# Patient Record
Sex: Female | Born: 1935 | Race: White | Hispanic: No | Smoking: Never smoker
Health system: Southern US, Community
[De-identification: ages and names within clinical notes are randomized; demographics above are authoritative.]

## PROBLEM LIST (undated history)

## (undated) DIAGNOSIS — E039 Hypothyroidism, unspecified: Secondary | ICD-10-CM

## (undated) DIAGNOSIS — I10 Essential (primary) hypertension: Secondary | ICD-10-CM

## (undated) DIAGNOSIS — C50919 Malignant neoplasm of unspecified site of unspecified female breast: Secondary | ICD-10-CM

## (undated) DIAGNOSIS — K5900 Constipation, unspecified: Secondary | ICD-10-CM

## (undated) DIAGNOSIS — K259 Gastric ulcer, unspecified as acute or chronic, without hemorrhage or perforation: Secondary | ICD-10-CM

## (undated) DIAGNOSIS — M1990 Unspecified osteoarthritis, unspecified site: Secondary | ICD-10-CM

## (undated) DIAGNOSIS — I442 Atrioventricular block, complete: Secondary | ICD-10-CM

## (undated) DIAGNOSIS — K219 Gastro-esophageal reflux disease without esophagitis: Secondary | ICD-10-CM

## (undated) HISTORY — PX: CHOLECYSTECTOMY: SHX55

## (undated) HISTORY — DX: Hypothyroidism, unspecified: E03.9

## (undated) HISTORY — DX: Essential (primary) hypertension: I10

## (undated) HISTORY — DX: Atrioventricular block, complete: I44.2

## (undated) HISTORY — DX: Malignant neoplasm of unspecified site of unspecified female breast: C50.919

## (undated) HISTORY — DX: Unspecified osteoarthritis, unspecified site: M19.90

## (undated) HISTORY — DX: Constipation, unspecified: K59.00

## (undated) HISTORY — DX: Gastro-esophageal reflux disease without esophagitis: K21.9

## (undated) HISTORY — DX: Gastric ulcer, unspecified as acute or chronic, without hemorrhage or perforation: K25.9

---

## 2002-02-10 ENCOUNTER — Encounter: Payer: Self-pay | Admitting: Surgery

## 2002-02-10 ENCOUNTER — Encounter: Admission: RE | Admit: 2002-02-10 | Discharge: 2002-02-10 | Payer: Self-pay | Admitting: Surgery

## 2002-02-10 ENCOUNTER — Encounter (INDEPENDENT_AMBULATORY_CARE_PROVIDER_SITE_OTHER): Payer: Self-pay

## 2002-02-10 ENCOUNTER — Other Ambulatory Visit: Admission: RE | Admit: 2002-02-10 | Discharge: 2002-02-10 | Payer: Self-pay | Admitting: Diagnostic Radiology

## 2002-02-17 ENCOUNTER — Encounter: Payer: Self-pay | Admitting: Surgery

## 2002-02-17 ENCOUNTER — Encounter: Admission: RE | Admit: 2002-02-17 | Discharge: 2002-02-17 | Payer: Self-pay | Admitting: Surgery

## 2002-02-19 ENCOUNTER — Encounter: Admission: RE | Admit: 2002-02-19 | Discharge: 2002-02-19 | Payer: Self-pay | Admitting: Surgery

## 2002-02-19 ENCOUNTER — Encounter (INDEPENDENT_AMBULATORY_CARE_PROVIDER_SITE_OTHER): Payer: Self-pay | Admitting: *Deleted

## 2002-02-19 ENCOUNTER — Ambulatory Visit (HOSPITAL_BASED_OUTPATIENT_CLINIC_OR_DEPARTMENT_OTHER): Admission: RE | Admit: 2002-02-19 | Discharge: 2002-02-20 | Payer: Self-pay | Admitting: Surgery

## 2002-02-19 ENCOUNTER — Encounter: Payer: Self-pay | Admitting: Surgery

## 2002-03-02 ENCOUNTER — Ambulatory Visit: Admission: RE | Admit: 2002-03-02 | Discharge: 2002-05-31 | Payer: Self-pay | Admitting: Radiation Oncology

## 2002-03-19 ENCOUNTER — Ambulatory Visit (HOSPITAL_COMMUNITY): Admission: RE | Admit: 2002-03-19 | Discharge: 2002-03-19 | Payer: Self-pay | Admitting: Oncology

## 2002-03-19 ENCOUNTER — Encounter: Payer: Self-pay | Admitting: Oncology

## 2002-03-23 ENCOUNTER — Encounter: Payer: Self-pay | Admitting: Oncology

## 2002-03-23 ENCOUNTER — Encounter: Payer: Self-pay | Admitting: Surgery

## 2002-03-23 ENCOUNTER — Ambulatory Visit (HOSPITAL_COMMUNITY): Admission: RE | Admit: 2002-03-23 | Discharge: 2002-03-23 | Payer: Self-pay | Admitting: Oncology

## 2002-03-23 ENCOUNTER — Ambulatory Visit (HOSPITAL_BASED_OUTPATIENT_CLINIC_OR_DEPARTMENT_OTHER): Admission: RE | Admit: 2002-03-23 | Discharge: 2002-03-23 | Payer: Self-pay | Admitting: Surgery

## 2002-07-22 ENCOUNTER — Ambulatory Visit: Admission: RE | Admit: 2002-07-22 | Discharge: 2002-10-13 | Payer: Self-pay | Admitting: Radiation Oncology

## 2003-08-13 ENCOUNTER — Ambulatory Visit (HOSPITAL_BASED_OUTPATIENT_CLINIC_OR_DEPARTMENT_OTHER): Admission: RE | Admit: 2003-08-13 | Discharge: 2003-08-13 | Payer: Self-pay | Admitting: Surgery

## 2003-10-28 ENCOUNTER — Ambulatory Visit (HOSPITAL_BASED_OUTPATIENT_CLINIC_OR_DEPARTMENT_OTHER): Admission: RE | Admit: 2003-10-28 | Discharge: 2003-10-28 | Payer: Self-pay | Admitting: Neurology

## 2004-01-06 ENCOUNTER — Encounter: Admission: RE | Admit: 2004-01-06 | Discharge: 2004-01-06 | Payer: Self-pay | Admitting: Family Medicine

## 2004-01-06 ENCOUNTER — Other Ambulatory Visit: Admission: RE | Admit: 2004-01-06 | Discharge: 2004-01-06 | Payer: Self-pay | Admitting: Family Medicine

## 2004-01-25 ENCOUNTER — Encounter: Admission: RE | Admit: 2004-01-25 | Discharge: 2004-01-25 | Payer: Self-pay | Admitting: Family Medicine

## 2005-01-11 ENCOUNTER — Ambulatory Visit: Payer: Self-pay | Admitting: Family Medicine

## 2005-04-05 ENCOUNTER — Ambulatory Visit: Payer: Self-pay | Admitting: Oncology

## 2005-07-25 ENCOUNTER — Ambulatory Visit: Payer: Self-pay | Admitting: Family Medicine

## 2005-10-04 ENCOUNTER — Ambulatory Visit: Payer: Self-pay | Admitting: Oncology

## 2005-12-06 ENCOUNTER — Ambulatory Visit: Payer: Self-pay | Admitting: Oncology

## 2005-12-31 HISTORY — PX: DOPPLER ECHOCARDIOGRAPHY: SHX263

## 2006-04-04 ENCOUNTER — Ambulatory Visit: Payer: Self-pay | Admitting: Oncology

## 2006-10-01 ENCOUNTER — Ambulatory Visit: Payer: Self-pay | Admitting: Oncology

## 2006-10-21 ENCOUNTER — Encounter: Admission: RE | Admit: 2006-10-21 | Discharge: 2006-10-21 | Payer: Self-pay | Admitting: Orthopedic Surgery

## 2006-11-01 ENCOUNTER — Ambulatory Visit: Payer: Self-pay | Admitting: Internal Medicine

## 2006-11-01 ENCOUNTER — Inpatient Hospital Stay (HOSPITAL_COMMUNITY): Admission: RE | Admit: 2006-11-01 | Discharge: 2006-11-06 | Payer: Self-pay | Admitting: Orthopedic Surgery

## 2006-11-01 ENCOUNTER — Ambulatory Visit: Payer: Self-pay | Admitting: Pulmonary Disease

## 2006-11-04 ENCOUNTER — Encounter: Payer: Self-pay | Admitting: Internal Medicine

## 2006-11-05 ENCOUNTER — Encounter: Payer: Self-pay | Admitting: Internal Medicine

## 2007-02-21 ENCOUNTER — Encounter: Admission: RE | Admit: 2007-02-21 | Discharge: 2007-02-21 | Payer: Self-pay | Admitting: Surgery

## 2007-02-21 ENCOUNTER — Encounter (INDEPENDENT_AMBULATORY_CARE_PROVIDER_SITE_OTHER): Payer: Self-pay | Admitting: Specialist

## 2007-03-12 ENCOUNTER — Ambulatory Visit: Payer: Self-pay | Admitting: Oncology

## 2007-08-14 ENCOUNTER — Ambulatory Visit: Payer: Self-pay | Admitting: Oncology

## 2007-12-16 ENCOUNTER — Ambulatory Visit: Payer: Self-pay | Admitting: Oncology

## 2008-02-15 IMAGING — RF DG ARTHROGRAM SHOULDER*R*
8 series · 8 of 8 positions shown · non-contrast
Comparison: none

CLINICAL DATA: Right shoulder pain.  Limited range of motion.  Assess for cuff tear. 
RIGHT SHOULDER ARTHROGRAM:
TECHNIQUE: The skin anterior to the right shoulder was cleansed and anesthetized.  A 22 gauge spinal needle was placed into the glenohumeral joint.  Lechner mixed with an equal volume of 1% Lidocaine was injected into the joint.  A total volume of 10 cc was injected.

[Series 1: arthrogram · 1 of 1 slices shown (1 of 8)]
[im 1/1]
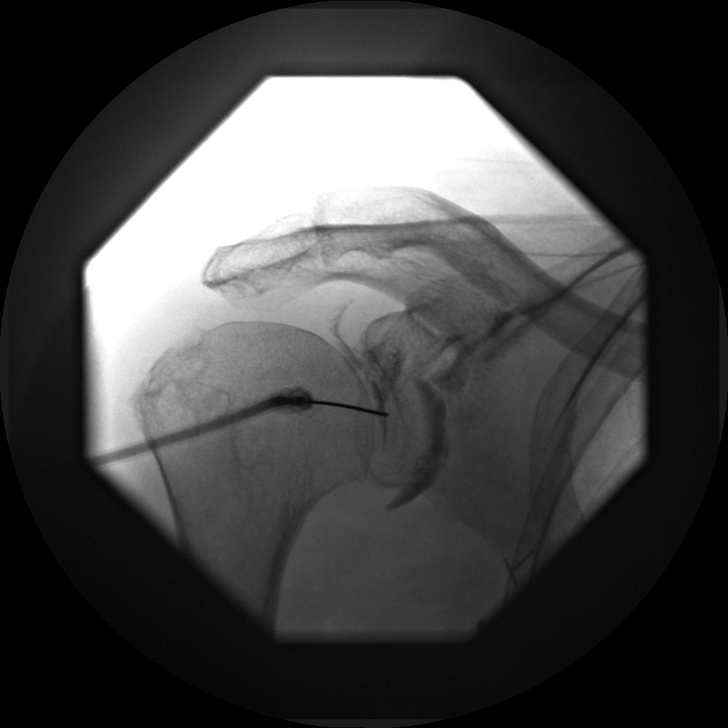

[Series 2: arthrogram · 1 of 1 slices shown (2 of 8)]
[im 1/1]
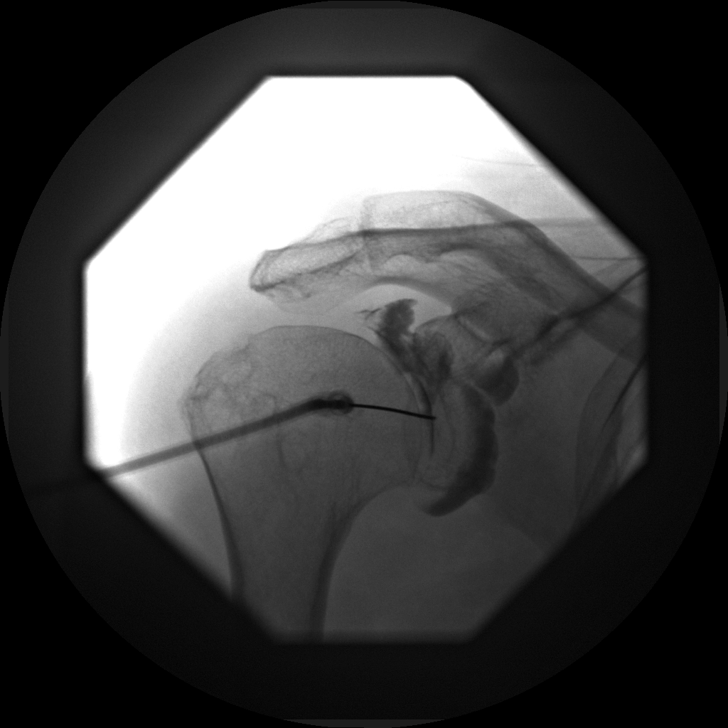

[Series 3: arthrogram · 1 of 1 slices shown (3 of 8)]
[im 1/1]
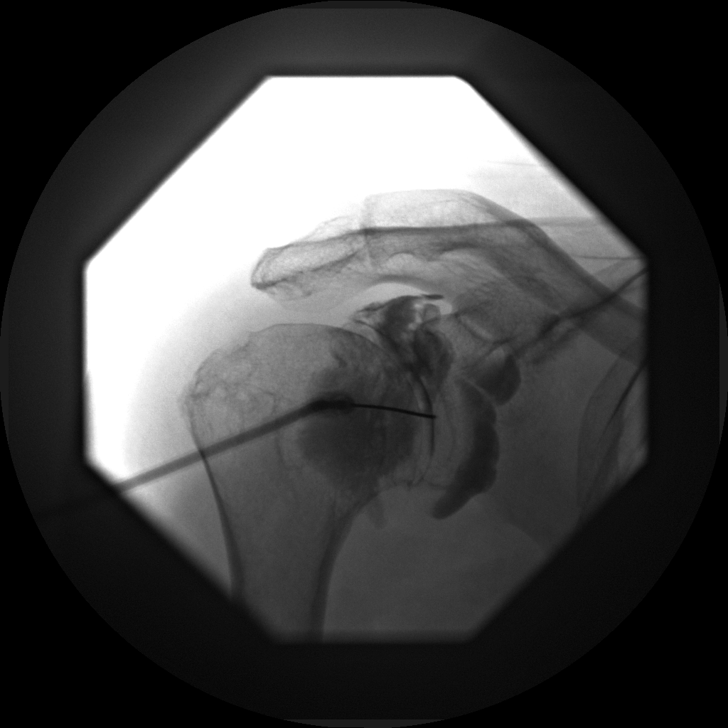

[Series 4: arthrogram · 1 of 1 slices shown (4 of 8)]
[im 1/1]
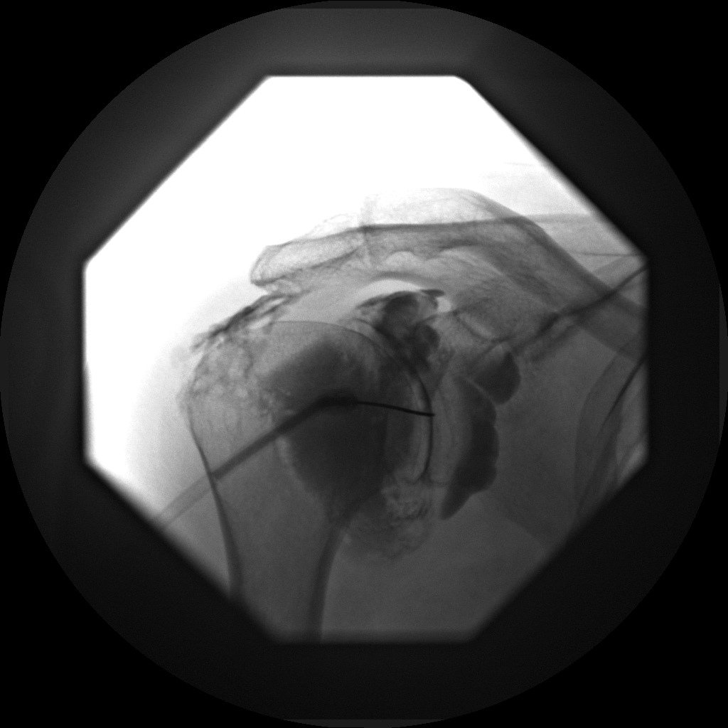

[Series 5: arthrogram · 1 of 1 slices shown (5 of 8)]
[im 1/1]
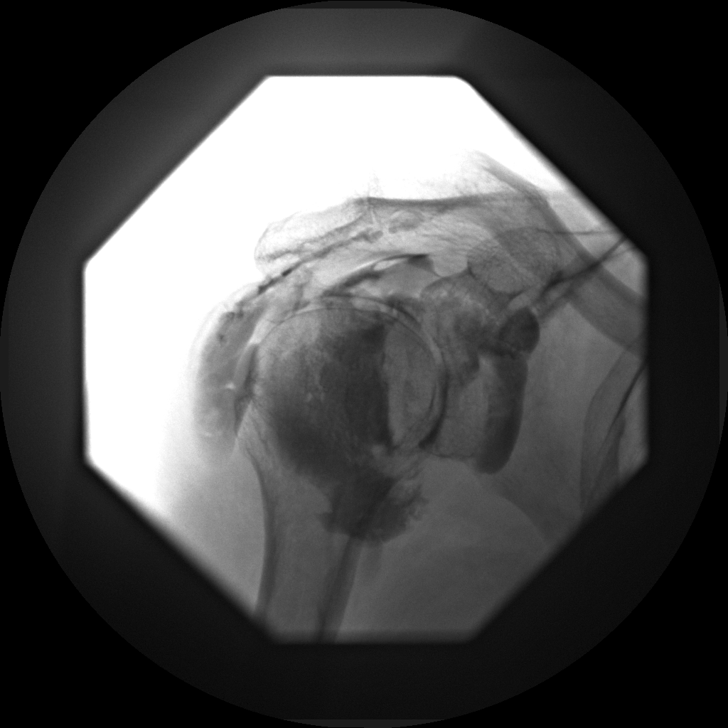

[Series 6: arthrogram · 1 of 1 slices shown (6 of 8)]
[im 1/1]
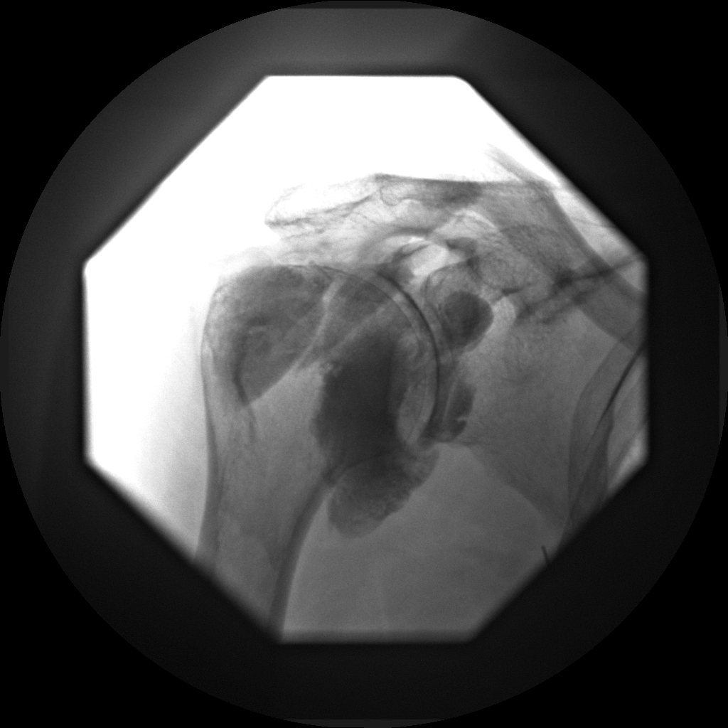

[Series 7: arthrogram · 1 of 1 slices shown (7 of 8)]
[im 1/1]
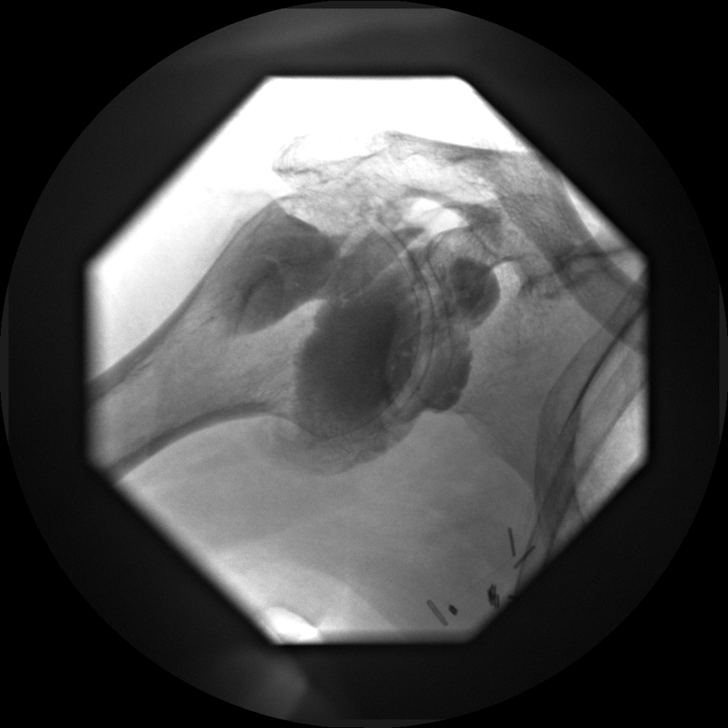

[Series 8: arthrogram · 1 of 1 slices shown (8 of 8)]
[im 1/1]
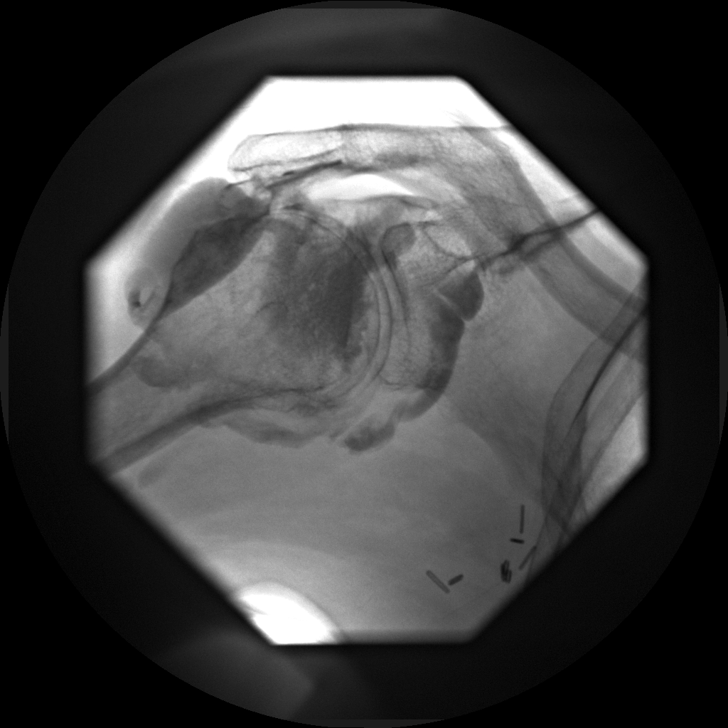

[8 of 8 positions shown; findings below may reference images not displayed]

FINDINGS: There is a large full thickness tear allowing free passage of contrast into the subacromial subdeltoid bursa.  I think this is probably in the supraspinatus region.  The glenohumeral articulation appears smooth.
IMPRESSION: Large full thickness rotator cuff tear allowing free passage of contrast into the subacromial subdeltoid bursa.  This tear is probably in the supraspinatus region.

## 2008-06-15 ENCOUNTER — Ambulatory Visit: Payer: Self-pay | Admitting: Oncology

## 2008-06-17 ENCOUNTER — Ambulatory Visit (HOSPITAL_COMMUNITY): Admission: RE | Admit: 2008-06-17 | Discharge: 2008-06-17 | Payer: Self-pay | Admitting: Oncology

## 2008-06-24 ENCOUNTER — Ambulatory Visit (HOSPITAL_COMMUNITY): Admission: RE | Admit: 2008-06-24 | Discharge: 2008-06-24 | Payer: Self-pay | Admitting: Oncology

## 2008-07-20 ENCOUNTER — Ambulatory Visit: Payer: Self-pay | Admitting: Ophthalmology

## 2008-07-20 ENCOUNTER — Other Ambulatory Visit: Payer: Self-pay

## 2008-08-03 ENCOUNTER — Ambulatory Visit: Payer: Self-pay | Admitting: Ophthalmology

## 2008-09-09 ENCOUNTER — Ambulatory Visit: Payer: Self-pay | Admitting: Ophthalmology

## 2008-09-14 ENCOUNTER — Ambulatory Visit: Payer: Self-pay | Admitting: Ophthalmology

## 2008-09-15 ENCOUNTER — Ambulatory Visit: Payer: Self-pay | Admitting: Oncology

## 2008-12-15 ENCOUNTER — Encounter: Admission: RE | Admit: 2008-12-15 | Discharge: 2008-12-15 | Payer: Self-pay | Admitting: Orthopedic Surgery

## 2009-08-22 ENCOUNTER — Ambulatory Visit: Payer: Self-pay | Admitting: Internal Medicine

## 2009-08-22 DIAGNOSIS — R195 Other fecal abnormalities: Secondary | ICD-10-CM

## 2009-08-22 DIAGNOSIS — K5900 Constipation, unspecified: Secondary | ICD-10-CM | POA: Insufficient documentation

## 2009-08-22 DIAGNOSIS — K219 Gastro-esophageal reflux disease without esophagitis: Secondary | ICD-10-CM | POA: Insufficient documentation

## 2009-08-23 ENCOUNTER — Telehealth: Payer: Self-pay | Admitting: Internal Medicine

## 2009-09-14 ENCOUNTER — Ambulatory Visit: Payer: Self-pay | Admitting: Oncology

## 2009-09-16 ENCOUNTER — Encounter: Payer: Self-pay | Admitting: Internal Medicine

## 2009-09-26 ENCOUNTER — Ambulatory Visit: Payer: Self-pay | Admitting: Internal Medicine

## 2009-10-04 ENCOUNTER — Encounter: Payer: Self-pay | Admitting: Internal Medicine

## 2009-10-04 ENCOUNTER — Ambulatory Visit: Payer: Self-pay | Admitting: Internal Medicine

## 2009-10-04 DIAGNOSIS — K259 Gastric ulcer, unspecified as acute or chronic, without hemorrhage or perforation: Secondary | ICD-10-CM | POA: Insufficient documentation

## 2009-10-04 LAB — CONVERTED CEMR LAB: UREASE: NEGATIVE

## 2009-10-05 ENCOUNTER — Encounter: Payer: Self-pay | Admitting: Internal Medicine

## 2010-06-30 ENCOUNTER — Encounter (HOSPITAL_COMMUNITY): Admission: RE | Admit: 2010-06-30 | Discharge: 2010-06-30 | Payer: Self-pay | Admitting: Orthopedic Surgery

## 2010-08-01 ENCOUNTER — Inpatient Hospital Stay (HOSPITAL_COMMUNITY): Admission: RE | Admit: 2010-08-01 | Discharge: 2010-08-04 | Payer: Self-pay | Admitting: Orthopedic Surgery

## 2010-09-11 ENCOUNTER — Ambulatory Visit: Payer: Self-pay | Admitting: Oncology

## 2011-03-16 LAB — CBC
HCT: 25.5 % — ABNORMAL LOW (ref 36.0–46.0)
MCH: 31.6 pg (ref 26.0–34.0)
MCHC: 34.9 g/dL (ref 30.0–36.0)
Platelets: 122 10*3/uL — ABNORMAL LOW (ref 150–400)
Platelets: 96 10*3/uL — ABNORMAL LOW (ref 150–400)
RBC: 2.24 MIL/uL — ABNORMAL LOW (ref 3.87–5.11)
RDW: 14.3 % (ref 11.5–15.5)
RDW: 14.6 % (ref 11.5–15.5)
WBC: 5.4 10*3/uL (ref 4.0–10.5)
WBC: 5.4 10*3/uL (ref 4.0–10.5)

## 2011-03-16 LAB — BASIC METABOLIC PANEL
BUN: 15 mg/dL (ref 6–23)
BUN: 17 mg/dL (ref 6–23)
CO2: 23 mEq/L (ref 19–32)
CO2: 27 mEq/L (ref 19–32)
Calcium: 7.1 mg/dL — ABNORMAL LOW (ref 8.4–10.5)
Calcium: 7.1 mg/dL — ABNORMAL LOW (ref 8.4–10.5)
Calcium: 7.3 mg/dL — ABNORMAL LOW (ref 8.4–10.5)
Creatinine, Ser: 0.84 mg/dL (ref 0.4–1.2)
Creatinine, Ser: 0.88 mg/dL (ref 0.4–1.2)
Creatinine, Ser: 1.12 mg/dL (ref 0.4–1.2)
GFR calc Af Amer: >60 mL/min (ref >60)
GFR calc non Af Amer: 47 mL/min — ABNORMAL LOW (ref >60)
GFR calc non Af Amer: >60 mL/min (ref >60)
Glucose, Bld: 103 mg/dL — ABNORMAL HIGH (ref 70–99)
Glucose, Bld: 112 mg/dL — ABNORMAL HIGH (ref 70–99)
Potassium: 3.7 mEq/L (ref 3.5–5.1)
Sodium: 140 mEq/L (ref 135–145)

## 2011-03-16 LAB — TYPE AND SCREEN: ABO/RH(D): A POS

## 2011-03-16 LAB — ABO/RH: ABO/RH(D): A POS

## 2011-03-16 LAB — HEMOGLOBIN AND HEMATOCRIT, BLOOD
HCT: 25.5 % — ABNORMAL LOW (ref 36.0–46.0)
Hemoglobin: 10.2 g/dL — ABNORMAL LOW (ref 12.0–15.0)
Hemoglobin: 8.8 g/dL — ABNORMAL LOW (ref 12.0–15.0)

## 2011-03-17 LAB — BASIC METABOLIC PANEL
BUN: 18 mg/dL (ref 6–23)
Calcium: 8.9 mg/dL (ref 8.4–10.5)
GFR calc non Af Amer: >60 mL/min (ref >60)
Glucose, Bld: 97 mg/dL (ref 70–99)
Potassium: 3.9 mEq/L (ref 3.5–5.1)

## 2011-03-17 LAB — CBC
HCT: 36.1 % (ref 36.0–46.0)
MCH: 32.2 pg (ref 26.0–34.0)
MCHC: 34.4 g/dL (ref 30.0–36.0)
RDW: 14.2 % (ref 11.5–15.5)

## 2011-03-17 LAB — DIFFERENTIAL
Basophils Absolute: 0 10*3/uL (ref 0.0–0.1)
Basophils Relative: 0 % (ref 0–1)
Eosinophils Absolute: 0 10*3/uL (ref 0.0–0.7)
Monocytes Absolute: 0.4 10*3/uL (ref 0.1–1.0)
Neutro Abs: 3.1 10*3/uL (ref 1.7–7.7)

## 2011-03-17 LAB — APTT: aPTT: 29 seconds (ref 24–37)

## 2011-03-17 LAB — URINALYSIS, ROUTINE W REFLEX MICROSCOPIC
Bilirubin Urine: NEGATIVE
Ketones, ur: NEGATIVE mg/dL
Nitrite: NEGATIVE
Urobilinogen, UA: 1 mg/dL (ref 0.0–1.0)

## 2011-05-18 NOTE — Op Note (Signed)
   NAME:  [name], [name]                       ACCOUNT NO.:  [id number]   MEDICAL RECORD NO.:  [id number]                   PATIENT TYPE:  AMB   LOCATION:  DSC                                  FACILITY:  [facility]   PHYSICIAN:  Currie Paris, M.D.           DATE OF BIRTH:  Nov 10, 1930   DATE OF PROCEDURE:  08/13/2003  DATE OF DISCHARGE:                                 OPERATIVE REPORT   PREOPERATIVE DIAGNOSIS:  Unnecessary porta-cath.   POSTOPERATIVE DIAGNOSIS:  Unnecessary porta-cath.   PROCEDURE PERFORMED:  Removal  of porta-cath.   SURGEON:  Currie Paris, M.D.   ANESTHESIA:  Local.   CLINICAL HISTORY:  [name] as finished her chemotherapy and wishes to  have her porta-cath removed.   DESCRIPTION OF PROCEDURE:  In the minor procedure room the area of  the  porta-cath was prepped with some alcohol  and anesthetized with 1% Xylocaine  with epinephrine using 10 mL. I waited 10 minutes, came back and then  reprepped with Betadine and draped out.   The old scar was opened. The porta-cath reservoir was identified. The  holding sutures were cut and the reservoir was  backed out. The tubing was  backed about half-way out and I put a little Vicryl suture in to make sure  there was no backbleeding through the tract. The catheter was removed intact  and the suture tied down.   Then 3-0 Vicryl and 4-0 Monocryl were used to close the incision in layers.  Dermabond was used on the skin.   The patient tolerated the procedure well. There were no complications noted.                                               Currie Paris, M.D.    CJS/MEDQ  D:  08/13/2003  T:  08/13/2003  Job:  [id number]

## 2011-05-18 NOTE — Op Note (Signed)
[place]. [facility]  Patient:    [name], [name] Visit Number: [id number] MRN: [id number]          Service Type: DSU Location: [facility] Attending Physician:  Charlton Haws Dictated by:   Currie Paris, M.D. Proc. Date: 03/23/02 Admit Date:  03/23/2002   CC:         Jillyn Hidden B. Truett Perna, M.D.   Operative Report  VISIT NUMBER:  [id number]  PREOPERATIVE DIAGNOSES: 1. Carcinoma of right breast. 2. Inadequate venous access for chemotherapy.  POSTOPERATIVE DIAGNOSES: 1. Carcinoma of right breast. 2. Inadequate venous access for chemotherapy.  OPERATION:  Placement of Port-A-Cath.  SURGEON:  Currie Paris, M.D.  ANESTHESIA:  MAC.  CLINICAL HISTORY:  This patient is a 77 year old getting ready to have chemotherapy tomorrow and needs better IV access.  DESCRIPTION OF PROCEDURE:  The patient was seen in the holding area and had no further questions regarding her surgery.  She was taken to the operating room and given IV sedation.  The entire upper chest and lower neck area was prepped and draped as a single sterile field.  The left subclavian vein was entered on the initial stick and the guidewire threaded easily and positioned in the superior vena cava.  Additional local was infiltrated and a transverse incision made anteriorly and just medial to the sternum in a pocket fashion for the reservoir.  Additional local was infiltrated, and a tunnel made from the guidewire site into the reservoir site, and the Port-A-Cath tubing pulled through.  It was flushed.  The guidewire tract was dilated once with a #10 dilator followed by the dilator and peel-away sheath.  They went easily. The dilator and guidewire were removed, and the catheter threaded easily and position appeared to be in the right atrium.  It aspirated and irrigated easily.  It was backed up so that it was in the superior vena cava right at the junction of the right atrium.  It  was then attached to the reservoir which had been previously flushed.  The reservoir aspirated and irrigated easily. The reservoir was sutured to the fascia with some 3-0 Prolene and appeared to lay in good position.  There was a fair amount of adipose tissue here, so I trimmed some of that off to make the reservoir a little bit more palpable for IV access.  The wound was closed with 3-0 Vicryl followed by 4-0 Monocryl subcuticular.  A right angle Huber point needle with tubing was used to access it to flush it a final time and leave the access available for chemotherapy tomorrow.  This went easily and aspirated and irrigated easily.  A final check with fluoroscopy showed good positioning.  No obvious pneumothorax and no kinking.  The catheter was flushed a final time with concentrated aqueous heparin. Steri-Strips were applied.  The patient tolerated the procedure well.  There were no operative complications.  All counts were correct. Dictated by:   Currie Paris, M.D. Attending Physician:  Charlton Haws DD:  03/23/02 TD:  03/24/02 Job: [id number]

## 2011-05-18 NOTE — H&P (Signed)
NAMEMAN, BONNEAU             ACCOUNT NO.:  [id number]   MEDICAL RECORD NO.:  [id number]          PATIENT TYPE:  INP   LOCATION:  1404                         FACILITY:  [facility]   PHYSICIAN:  Marlowe Kays, M.D.  DATE OF BIRTH:  1930-12-04   DATE OF ADMISSION:  11/01/2006  DATE OF DISCHARGE:  11/06/2006                                HISTORY & PHYSICAL   This History and Physical was dictated from the hospital record of admission  of November 01, 2006.   CHIEF COMPLAINT:  Pain in my right shoulder.   HISTORY OF PRESENT ILLNESS:  This 77 year old white female with continuing  problems concerning pain into her right shoulder with difficulty and range  of motion through the right shoulder on any movement.   Studies had shown a full thickness rotator cuff tear on the arthrogram.  Since she is a very active lady, it was felt that she would benefit from  surgical intervention. Was scheduled for repair of the full thickness  rotator cuff of the right shoulder.   ALLERGIES:  The patient is allergic to SULFA which causes itching.   She does have some hypertension. Denies any cardiac problems. She has  hypothyroidism. She also has some mild reflux. Her family P.A. is Mr. Lonie Peak, physician's assistant, at [employer] in [place],  [place].   PAST MEDICAL HISTORY:  Lumpectomy for breast cancer in 2003. She had chemo  and radiation after that. Hysterectomy 1969, cholecystectomy 1994, bilateral  trigger release of her hands in 2006.   CURRENT MEDICATIONS:  1. Levothyroxine 100 mg p.o. q.a.m.  2. Arimidex 1 mg p.o. q.a.m.  3. Omeprazole 20 mg p.o. two tabs in a.m.  4. Hydrochlorothiazide 25 mg p.o. q.a.m.  5. Ecotrin 325 mg p.o. q.a.m. Will stop prior to surgery.  6. Caltrate Plus 600 mg one b.i.d.  7. Mirapex 1 mg tab as needed during the day.  8. Gabapentin 300 mg p.o. q.h.s.  9. Lopressor 75 mg b.i.d.   FAMILY HISTORY/SOCIAL HISTORY:   Noncontributory.   REVIEW OF SYSTEMS:  CNS:  No seizures, auto paralysis, numbness, double  vision. RESPIRATORY:  No productive cough, no hemoptysis, no shortness of  breath. CARDIOVASCULAR:  No chest pain, no angina, no orthopnea. Denies any  cardiac history. GASTROINTESTINAL:  No nausea, vomiting, melena, bloody  stool. She does have mild reflux which is controlled with medication.  GENITOURINARY:  No discharge, dysuria, hematuria. MUSCULOSKELETAL:  Primarily in Present Illness.   PHYSICAL EXAMINATION:  VITAL SIGNS:  Vital signs are not listed on the  admission health history.  GENERAL:  Alert, cooperative 77 year old white female.  HEENT:  Normocephalic, PERRLA, EOMI intact, oropharynx is clear.  NECK:  Supple, no lymphadenopathy.  CHEST:  Clear to auscultation, rhonchi or rales.  HEART:  With a regular rate and rhythm.  ABDOMEN:  Soft, nontender.  GENITOURINARY:  Not done. Not pertinent to Present Illness.  EXTREMITIES:  Tender rotator cuff of the right shoulder and painful range of  motion.   ADMISSION DIAGNOSES:  1. Tear of the rotator cuff of the right shoulder.  2.  Hypothyroidism.  3. Hypertension.  4. Mild gastroesophageal reflux.   PLAN:  The patient will undergo open repair of the rotator cuff of the right  shoulder.      Dooley L. Cherlynn June.    ______________________________  Marlowe Kays, M.D.    DLU/MEDQ  D:  11/20/2006  T:  11/20/2006  Job:  [id number]

## 2011-05-18 NOTE — Consult Note (Signed)
NAMEJOSSALYN, Palmer             ACCOUNT NO.:  [id number]   MEDICAL RECORD NO.:  [id number]          PATIENT TYPE:  AMB   LOCATION:  DAY                          FACILITY:  [facility]   PHYSICIAN:  Madolyn Frieze. Jens Som, MD, FACCDATE OF BIRTH:  1930/06/19   DATE OF CONSULTATION:  11/01/2006  DATE OF DISCHARGE:                                   CONSULTATION   [name] is a 77 year old female with a past medical history of  hypertension, gastroesophageal reflux disease, hypothyroidism, history of  breast cancer status post left lumpectomy in 2003, and now status post  attempt at rotator cuff repair, who we are asked to evaluate for pulmonary  edema.  She apparently has  no prior cardiac history, although she is  intubated and just awakening from general anesthesia, therefore, the history  is somewhat difficult.  She presented today for repair of her rotator cuff.  She was placed under general anesthesia.  They sat the patient up and were  beginning the incision when she developed sudden bradycardia with heart  rates in the 30s and hypotension with systolic blood pressure in the 60s.  She was subsequently given Neo-Synephrine, Robinul, and Ephedrine.  She then  became hypertensive with a systolic blood pressure of approximately 180 and  diastolic 110.  Her heart rate increased to 150.  She subsequently developed  pulmonary edema and cardiology is asked to further evaluate.  Note, the  procedure was aborted.  At present, she is intubated and somewhat lethargic  from her anesthesia.  However, she states her head no when asked about chest  pain.   Her past medical history is significant for breast cancer status post  lumpectomy in 2003.  She also has hypertension, hypothyroidism, and  gastroesophageal reflux disease.  Note, she did have an Adenosine Myoview in  January 2005 and this showed an ejection fraction of 70% and no ischemia or  infarction.   MEDICATIONS PRIOR TO ADMISSION:   Synthroid 100 mcg p.o. daily, Arimidex 1 mg  daily, Prilosec 40 mg daily, hydrochlorothiazide 25 mg daily, aspirin 325 mg  p.o. daily, Caltrate, Mirapex, and Gabapentin.   ALLERGIES:  SULFA.   Her social history, family history, and review of systems are not obtainable  as she is intubated and just recovered from general anesthesia.   PHYSICAL EXAMINATION:  VITAL SIGNS:  Blood pressure at present is 92/56 and pulse 101.  GENERAL:  She is well developed and intubated.  She does not respond to  questions other than a gentle shake of her head.  HEENT:  Unremarkable with normal eyelids.  NECK:  Supple with a normal upstroke bilaterally and I cannot appreciate  bruits.  There is no jugular venous distention and I cannot appreciate  thyromegaly.  CHEST:  Significant for diffuse rhonchi and rales.  CARDIOVASCULAR:  Regular rate and rhythm with a normal S1 and S2.  Her heart  sounds are distant due to her pulmonary exam.  however, I cannot appreciate  murmurs, gallops, and rubs.  ABDOMEN:  Nontender, nondistended, positive bowel sounds, no  hepatosplenomegaly, no masses appreciated.  There is no  abdominal bruit.  She has 2+ femoral pulses bilaterally and no bruits.  EXTREMITIES:  No edema, I can palpate no cords.  She has 2+ dorsalis pedes  pulses bilaterally.  NEUROLOGICAL:  Not obtainable due to the patient's intubated status.   Her electrocardiogram shows a sinus rhythm at a rate of 99.  There is a  right bundle branch block and left anterior fascicular block.  There is left  ventricular hypertrophy.  Her chest x-ray shows diffuse pulmonary edema.  Her hemoglobin is 12.4 with hematocrit 36.5.  Her potassium was 3.7 with BUN  20 and creatinine 0.7.   DIAGNOSIS:  1. Acute pulmonary edema.  2. Status post attempt at repair of rotator cuff.  3. History of hypertension.  4. History of hypothyroidism.   PLAN:  Ms. Heine is now intubated and has developed acute pulmonary  edema  following her surgical attempts.  We feel this is most likely related  to therapy for her hypotension and bradycardia in the operating room.  After  receiving medications, her blood pressure did increase and she became  tachycardic with a heart rate of 150.  Certainly, this could have caused her  pulmonary edema particularly if it was superimposed on underlying coronary  disease.  We would recommend weaning her pressors as tolerated.  I would  recommend adding aspirin and heparin if OK with surgery.  We will add  Lopressor after her pressors are weaned as tolerated by pulse and blood  pressure.  We would also recommend gentle diuresis as tolerated by blood  pressure.  We will check an echocardiogram to quantify left ventricular  function.  We will cycle enzymes to rule out myocardial infarction.  Ultimately, after extubation and the patient is more stable, she may require  cardiac catheterization.  We will consult critical care to wean the  ventilator.           ______________________________  Madolyn Frieze. Jens Som, MD, [facility]     BSC/MEDQ  D:  11/01/2006  T:  11/02/2006  Job:  [id number]

## 2011-05-18 NOTE — Op Note (Signed)
[place]. [facility]  Patient:    [name], [name] Visit Number: [id number] MRN: [id number]          Service Type: DSU Location: [facility] Attending Physician:  Charlton Haws Dictated by:   Currie Paris, M.D. Proc. Date: 02/19/02 Admit Date:  02/19/2002 Discharge Date: 02/19/2002   CC:         [facility], Dr. Elnita Maxwell Lugion  Evette Georges, M.D. [facility]   Operative Report  VISIT NUMBER:  [id number]  CCS NUMBER:  [phone]  PREOPERATIVE DIAGNOSIS:  Carcinoma of right breast, upper inner quadrant.  POSTOPERATIVE DIAGNOSIS:  Carcinoma of right breast, upper inner quadrant.  OPERATION:  Right needle-guided excisional lumpectomy with blue dye injection and sentinel lymph node dissection followed by complete axillary dissection.  SURGEON:  Currie Paris, M.D.  ANESTHESIA:  General (LMA).  CLINICAL HISTORY:  This patient has presented with a breast mass seen on mammography and core biopsy has shown carcinoma.  After discussion of alternatives with the patient, she is thought to be a good candidate for needle localization, wide local excision, and sentinel node dissection with possible full axillary dissection.  DESCRIPTION OF PROCEDURE:  The patient was brought to the operating room and after satisfactory general anesthesia had been obtained, the right breast was prepped and draped.  It was then injected with some blue dye.  The guidewire entered it medially and the breast then tracked a little bit laterally in the upper inner quadrant.  There was a mark that had been marked on the skin overlying the mass.  I initially made an elliptical incision to encompass the prior core biopsy site, and I go directly over and encompass the area of the tumor.  Using coagulation current of the cautery and cutting current, wide excision was taken, basically down to the muscle, although a little bit of fatty tissue  was left on the fascia as I initially came under this area.  On inspection, I seemed to be a little bit close at the deep superior area, and while this was going for specimen mammography, I went ahead and took the fascia off of here and around toward the superior area.  Once this was done, bleeders were electrocoagulated.  I raised the flaps underneath the breast so that it would make closure a little easier and temporarily placed a pack here.  Using the Neoprobe, I identified a hot area in the axilla, and made a short transverse incision, divided through the subcutaneous tissues, and initially encountered a lymph node which was not blue, and was fairly soft and fairly superficial, but had counts of around 300 with the background counts being around 10-15.  This was excised and sent as a sentinel node.  Further dissection just adjacent to this, I saw a blue lymphatic and traced this down a little further, and saw a soft blue node which was excised.  This also had counts in the range of about 700-800.  A little further dissection showed a second lymphatic and a larger blue lymph node that again was soft, but this one had counts of about 1400.  It was also excised.  Having removed this, I found no other hot areas in the axilla, nor any other blue areas.  While waiting for pathology, I went ahead and closed this area with some Vicryl followed by Monocryl.  I returned my attention to the breast area and closed it in several layers with 3-0 Vicryl followed  by 4-0 Monocryl.  A few minutes later, pathology reported that the lumpectomy specimen, the margin was close deep which is where I knew it was and we had already taken some more tissue, and that the third of the three lymph nodes was positive.  At this point, I reopened the incision, and extended it posteriorly, and continued dividing all subcutaneous tissues and axillary fatty tissue down to the chest wall.  I then opened along the  clavipectoral fascia, and traced this superiorly, pulling this tissue down until I could see the axillary vein.  The contents were then swept from here, medial-to-lateral and superior-to-inferior.  The long thoracic and thoracodorsal nerves were identified.  The second intercostal had to be taken and it was clipped.  Clips were used on the larger vessels and the cautery on the remaining.  At least one node by palpation appeared positive within this tissue, and it was close to the axillary vein, but not very high up in the axilla, more about the midportion of the dissection.  When I got out towards the latissimus and thoracodorsal vessels, the dissection was completed by dividing the remaining attachments here.  The wound was irrigated and checked for hemostasis and everything appeared to be dry.  I injected with a little Marcaine around the long thoracic, thoracodorsal, and second intercostal to see if that would help with postoperative analgesia.  A 19 Blake drain was placed through a separate stab wound and secured with a 3-0 nylon.  The wound was closed with 3-0 Vicryl followed by staples.  The patient tolerated the procedure well.  There were no operative complications.  All counts were correct. Dictated by:   Currie Paris, M.D. Attending Physician:  Charlton Haws DD:  02/19/02 TD:  02/20/02 Job: [id number]

## 2011-05-18 NOTE — Op Note (Signed)
NAMEJORDIE, SKALSKY             ACCOUNT NO.:  [id number]   MEDICAL RECORD NO.:  [id number]          PATIENT TYPE:  AMB   LOCATION:  DAY                          FACILITY:  [facility]   PHYSICIAN:  Marlowe Kays, M.D.  DATE OF BIRTH:  27-Jul-1930   DATE OF PROCEDURE:  11/01/2006  DATE OF DISCHARGE:                                 OPERATIVE REPORT   PREOPERATIVE DIAGNOSIS:  Full thickness rotator cuff tear right shoulder.   POSTOPERATIVE DIAGNOSIS:  1. Probable full thickness rotator cuff tear, right shoulder.  2. Interoperative cardiopulmonary disturbances leading to premature      termination of the operation.   OPERATION:  Incision and closure of right shoulder down through the  subcutaneous tissue.   SURGEON:  Marlowe Kays, M.D.   ASSISTANT:  Jamelle Rushing, P.A.-C.   ANESTHESIA:  General.   JUSTIFICATION FOR OF PROCEDURE:  She had a full thickness rotator cuff tear  demonstrated on arthrogram.  Preoperatively, she had an interscalene block  with no problems.  When I saw her in the anesthesia holding area just before  going back to the operating room, I did notice a cardiac arrhythmia which  seemed to be more atrial and was noted also by anesthesia.   DESCRIPTION OF PROCEDURE:  After inducing general anesthesia, intubating,  and placing her in the side position on the [place] frame, she began having  hypotension and bradycardia. Appropriate medicines were given by anesthesia.  She subsequently developed hypertension and tachycardia but it was felt that  she was stabilized enough that we could proceed with the surgery.  The  shoulder girdle, by now, had been prepped with DuraPrep and draped in a  sterile field.  I made the usual rotator cuff vertical incision and was down  to the skin and subcutaneous tissue.  Bleeding was normal.  At this point,  the anesthesiologist advised me that she was in pulmonary edema and it felt  it best to abort the operation.  Accordingly, I  closed the wound was 2-0  Vicryl on the subcutaneous tissues, Steri-Strips on the skin.  Dry sterile  dressing and shoulder mobilizer applied.  At this point, we placed a Foley  catheter.  The plan will be to place her on the ventilator in the PACU and  have a cardiology consult.  The family will be also advised of the  complication.           ______________________________  Marlowe Kays, M.D.     JA/MEDQ  D:  11/01/2006  T:  11/01/2006  Job:  [id number]

## 2011-05-18 NOTE — Discharge Summary (Signed)
[name], [name]             ACCOUNT NO.:  [id number]   MEDICAL RECORD NO.:  [id number]          PATIENT TYPE:  INP   LOCATION:  1404                         FACILITY:  [facility]   PHYSICIAN:  Bevelyn Buckles. Bensimhon, MDDATE OF BIRTH:  Sep 30, 1930   DATE OF ADMISSION:  11/01/2006  DATE OF DISCHARGE:  11/06/2006                                 DISCHARGE SUMMARY   PRINCIPAL DIAGNOSIS:  Acute pulmonary edema.   SECONDARY DIAGNOSES:  1. Right full thickness rotator cuff tear.  2. History of breast cancer, status post lumpectomy in 2003.  3. Hypertension.  4. Hypothyroidism.  5. Gastroesophageal reflux disease.   ALLERGIES:  SULFA.   PROCEDURES:  A 2D echocardiogram and Adenosine Myoview.   HISTORY OF PRESENT ILLNESS:  A 77 year old white female with a prior history  of hypertension.  She has no prior history of coronary artery disease and in  fact had a negative Adenosine Myoview in January, 2005 with an EF of 70% and  no ischemia or infarction.  She presented to [facility] on  November 01, 2006 for elective right rotator cuff repair with Dr. Marlowe Kays.  Shortly after induction, the patient was positioned upright for  surgery and subsequently developed hypotension and bradycardia, which was  managed by anesthesia with Neo-Synephrine and dopamine infusions.  She was  also noted to have frothy, blood-tinged secretions in her ET tube.  She was  given 40 mg of IV Lasix.  We were then called to acutely consult for  pulmonary edema.  On arrival, the patient was intubated and sedated in sinus  rhythm without any acute ST/T changes.  Her chest x-ray was consistent with  pulmonary edema, and we admitted her to our service for rule-out and cardiac  evaluation.   HOSPITAL COURSE:  Pulmonary/critical care medicine was consulted for  ventilator management.  [name] was subsequently extubated the morning  of November 3rd.  She was placed on Lasix 40 mg IV daily on admission,  and  follow-up chest x-ray on November 3rd showed improvement in edema.  Her IV  Lasix was then discontinued, and she was initiated on low dose beta blocker  therapy.  She did peak her CK at 591, her MB at 8.5, and her troponin I to a  peak of 0.84.  Following extubation, she was doing well without complaints  and was transferred to telemetry.  She underwent a 2D echocardiogram on  November 5th revealing an EF of 60-65% without any regional wall motion  abnormalities.  Because her echo was normal, the decision was made to pursue  a noninvasive study to rule out ischemia, and she underwent an Adenosine  Myoview on November 6th showing a small area of fixed apical thinning  without evidence of reversible ischemia.  Overall left ventricular systolic  function was normal without regional wall motion abnormalities.  We have  encouraged ambulation this evening from [name] with plans to  discharge on November 7th.   DISCHARGE LABS:  Hemoglobin 10.8, hematocrit 30.8, WBC 4, platelets 172.  Sodium 138, potassium 3.8, chloride 104, CO2 29, BUN 6, creatinine  0.6,  glucose 114, calcium 8.7, CK 562, MB 5.7, troponin I 0.34.  Stool for C.  diff was negative.  BNP on admission was 72.3.   DISPOSITION:  Patient will be discharged home in satisfactory condition.   FOLLOW-UP PLANS/APPOINTMENTS:  She is to follow up with her primary care  physician, Dr. Kelle Darting in 1-2 weeks.  She is to follow up with Dr.  Simonne Come as previously scheduled.  Of note, the patient would prefer to be  managed conservatively with regards to her rotator cuff from here forward.   DISCHARGE MEDICATIONS:  1. Aspirin 81 mg daily.  2. Lopressor 25 mg b.i.d.  3. HCTZ 25 mg daily.  4. Synthroid 100 mcg daily.  5. Arimidex 1 mg daily.  6. Mirapex 1.5 mg nightly.  7. Neurontin 300 mg nightly.  8. Calcium plus D b.i.d.  9. Omeprazole 40 mg daily.   OUTSTANDING LABS/STUDIES:  None.   DURATION OF DISCHARGE ENCOUNTER:   Forty-five minutes, including physician  time.      [name], ANP      Bevelyn Buckles. Bensimhon, MD  Electronically Signed    CB/MEDQ  D:  11/05/2006  T:  11/06/2006  Job:  [id number]   cc:   Tinnie Gens A. Tawanna Cooler, MD  [address] [place]  [place] [address]   Marlowe Kays, M.D.  Fax: [phone]

## 2011-10-01 ENCOUNTER — Inpatient Hospital Stay (HOSPITAL_COMMUNITY)
Admission: EM | Admit: 2011-10-01 | Discharge: 2011-10-04 | DRG: 244 | Disposition: A | Discharge status: Home / Self Care | Payer: Medicare Other | Attending: Cardiology | Admitting: Cardiology

## 2011-10-01 ENCOUNTER — Emergency Department (HOSPITAL_COMMUNITY): Payer: Medicare Other

## 2011-10-01 DIAGNOSIS — H9190 Unspecified hearing loss, unspecified ear: Secondary | ICD-10-CM | POA: Diagnosis present

## 2011-10-01 DIAGNOSIS — I498 Other specified cardiac arrhythmias: Secondary | ICD-10-CM | POA: Diagnosis present

## 2011-10-01 DIAGNOSIS — Z96649 Presence of unspecified artificial hip joint: Secondary | ICD-10-CM

## 2011-10-01 DIAGNOSIS — Z79899 Other long term (current) drug therapy: Secondary | ICD-10-CM

## 2011-10-01 DIAGNOSIS — I080 Rheumatic disorders of both mitral and aortic valves: Secondary | ICD-10-CM | POA: Diagnosis present

## 2011-10-01 DIAGNOSIS — I10 Essential (primary) hypertension: Secondary | ICD-10-CM | POA: Diagnosis present

## 2011-10-01 DIAGNOSIS — Z7982 Long term (current) use of aspirin: Secondary | ICD-10-CM

## 2011-10-01 DIAGNOSIS — I442 Atrioventricular block, complete: Principal | ICD-10-CM | POA: Diagnosis present

## 2011-10-01 DIAGNOSIS — E876 Hypokalemia: Secondary | ICD-10-CM | POA: Diagnosis present

## 2011-10-01 DIAGNOSIS — Z23 Encounter for immunization: Secondary | ICD-10-CM

## 2011-10-01 DIAGNOSIS — Z9849 Cataract extraction status, unspecified eye: Secondary | ICD-10-CM

## 2011-10-01 DIAGNOSIS — Z853 Personal history of malignant neoplasm of breast: Secondary | ICD-10-CM

## 2011-10-01 DIAGNOSIS — E039 Hypothyroidism, unspecified: Secondary | ICD-10-CM | POA: Diagnosis present

## 2011-10-01 DIAGNOSIS — K219 Gastro-esophageal reflux disease without esophagitis: Secondary | ICD-10-CM | POA: Diagnosis present

## 2011-10-01 DIAGNOSIS — M1990 Unspecified osteoarthritis, unspecified site: Secondary | ICD-10-CM | POA: Diagnosis present

## 2011-10-01 LAB — CBC
HCT: 35.1 % — ABNORMAL LOW (ref 36.0–46.0)
Hemoglobin: 11.4 g/dL — ABNORMAL LOW (ref 12.0–15.0)
MCH: 31.3 pg (ref 26.0–34.0)
MCV: 96.4 fL (ref 78.0–100.0)
RBC: 3.64 MIL/uL — ABNORMAL LOW (ref 3.87–5.11)

## 2011-10-01 LAB — PROTIME-INR
INR: 1.04 (ref 0.00–1.49)
Prothrombin Time: 13.8 seconds (ref 11.6–15.2)

## 2011-10-01 LAB — CK TOTAL AND CKMB (NOT AT ARMC)
CK, MB: 2.2 ng/mL (ref 0.3–4.0)
Relative Index: INVALID (ref 0.0–2.5)

## 2011-10-01 LAB — TSH: TSH: 7.879 u[IU]/mL — ABNORMAL HIGH (ref 0.350–4.500)

## 2011-10-01 LAB — BASIC METABOLIC PANEL
BUN: 28 mg/dL — ABNORMAL HIGH (ref 6–23)
CO2: 23 mEq/L (ref 19–32)
Calcium: 8.9 mg/dL (ref 8.4–10.5)
Chloride: 107 mEq/L (ref 96–112)
Creatinine, Ser: 0.6 mg/dL (ref 0.50–1.10)
Glucose, Bld: 100 mg/dL — ABNORMAL HIGH (ref 70–99)

## 2011-10-01 LAB — APTT: aPTT: 24 seconds (ref 24–37)

## 2011-10-01 LAB — T4, FREE: Free T4: 0.97 ng/dL (ref 0.80–1.80)

## 2011-10-01 LAB — CARDIAC PANEL(CRET KIN+CKTOT+MB+TROPI): Relative Index: INVALID (ref 0.0–2.5)

## 2011-10-02 ENCOUNTER — Inpatient Hospital Stay (HOSPITAL_COMMUNITY): Payer: Medicare Other

## 2011-10-02 LAB — LIPID PANEL
Cholesterol: 156 mg/dL (ref 0–200)
Total CHOL/HDL Ratio: 4.2 RATIO
Triglycerides: 107 mg/dL (ref <150)
VLDL: 21 mg/dL (ref 0–40)

## 2011-10-02 LAB — CARDIAC PANEL(CRET KIN+CKTOT+MB+TROPI): Troponin I: <0.3 ng/mL (ref <0.30)

## 2011-10-02 LAB — COMPREHENSIVE METABOLIC PANEL
Albumin: 3.1 g/dL — ABNORMAL LOW (ref 3.5–5.2)
BUN: 20 mg/dL (ref 6–23)
Creatinine, Ser: 0.65 mg/dL (ref 0.50–1.10)
Total Protein: 5.6 g/dL — ABNORMAL LOW (ref 6.0–8.3)

## 2011-10-03 NOTE — Discharge Summary (Addendum)
NAMEAMARA, JUSTEN             ACCOUNT NO.:  [id number]  MEDICAL RECORD NO.:  [id number]  LOCATION:  2021                         FACILITY:  [facility]  PHYSICIAN:  Madolyn Frieze. Jens Som, MD, FACCDATE OF BIRTH:  1930/04/01  DATE OF ADMISSION:  10/01/2011 DATE OF DISCHARGE:  10/03/2011                              DISCHARGE SUMMARY   DISCHARGE DIAGNOSES: 1. Symptomatic bradycardia/complete heart block, status post St. Jude     Accent DR RF dual-chamber pacemaker on October 01, 2011. 2. Hypertension.  SECONDARY DISCHARGE DIAGNOSES: 1. History of pulmonary edema in the setting of rotator cuff surgery     in November 2007.     a.     In November 2007, 2-D echocardiogram demonstrating ejection      fraction of 60-65% with no regional wall motion abnormalities.     b.     Myoview on November 05, 2006, showing small area of fixed      apical thinning with normal left ventricular  function without      wall motion abnormalities.  No ischemia.2. Gastroesophageal reflux disease. 3. Hypothyroidism with elevated TSH of 7.879 and free T4 of 0.97, for     outpatient followup and repeat labs. 4. History of left breast cancer, status post lumpectomy in 2003 with     chemotherapy and radiation. 5. History of right rotator cuff repair with attempted repair in     November 2007 avoid, secondary to pulmonary edema. 6. Osteoarthritis, status post left total hip arthroplasty in August     2011. 7. Hard-of-hearing. 8. History of vertigo. 9. Cataract surgery. 10.History of cholecystitis. 11.History of shingles. 12.History of left thigh boil.  HOSPITAL COURSE:  [name] is a 77 year old female with the above problems who presented to [facility] ER with complaints of progressive fatigue and weakness as well as dyspnea on exertion.  She had profound fatigue complicated by presyncope on the day of admission.  She decided she would see her primary care doctor and drove herself there.  There, she was  noted to be bradycardic with heart rates in the 30s and EKG showed complete heart block.  She was subsequently admitted to hospital. Cardiac enzymes were cycled, which were negative.  Her TSH was noted to be mildly abnormal, but free T4 was normal.  She was seen by the electrophysiologist, Dr. Lewayne Bunting who felt that pacemaker implantation was appropriate.  She underwent this procedure on October 01, 2011, without complication.  Dr. Ladona Ridgel was able to start on Coreg postprocedurally for elevated blood pressures.  On the day of discharge, she is feeling well.  Dr. Jens Som has seen and examined her today and feels she is stable for discharge.  DISCHARGE LABS:  Sodium 142, potassium 4, chloride 110, CO2 of 26, glucose 113, BUN 20, and creatinine 0.65.  TSH 7.879 and free T4 of 0.97.  STUDIES: 1. Chest x-ray on October 02, 2011, showed pacemaker leads in proper     position without pneumothorax or edema. 2. Two-D echocardiogram on October 02, 2011, showing EF of 30-35% with     diffuse hypokinesis.  Mild mitral and aortic regurgitation.  No     defect or PFO is identified.  DISCHARGE MEDICATIONS: 1. Coreg 6.25 mg b.i.d. 2. Actonel 35 mg every 7 days. 3. Aspirin 81 mg daily. 4. Caltrate 600 mg 2 tablets daily. 5. Docusate 100 mg daily nightly. 6. Gabapentin 600 mg nightly. 7. Levothyroxine 125 mcg every morning. 8. Omeprazole 20 mg every morning. 9. Zolpidem 5 mg daily nightly p.r.n.  DISPOSITION:  Ms. Henrie will be discharged in stable condition to home.  She was given the pacemaker instruction sheet regarding activities of bathing activity and wound care.  She will follow up with the [facility] on October 11, 2011, at 3:30 p.m.  She is also to follow up with her PCP regarding recheck of thyroid function studies.  Duration of discharge encounter greater than 31 including physician and PA time.     Darline Faith, P.A.C.   ______________________________ Madolyn Frieze. Jens Som, MD, [facility]    DD/MEDQ  D:  10/03/2011  T:  10/03/2011  Job:  [id number]  cc:   Lonie Peak, PA  Electronically Signed by Olga Millers MD [facility] on 10/03/2011 02:33:02 PM Electronically Signed by Ronie Spies  on 10/03/2011 09:16:56 PM

## 2011-10-04 ENCOUNTER — Telehealth: Payer: Self-pay | Admitting: Physician Assistant

## 2011-10-04 LAB — BASIC METABOLIC PANEL
CO2: 29 mEq/L (ref 19–32)
Calcium: 9 mg/dL (ref 8.4–10.5)
Chloride: 106 mEq/L (ref 96–112)
Glucose, Bld: 121 mg/dL — ABNORMAL HIGH (ref 70–99)
Sodium: 140 mEq/L (ref 135–145)

## 2011-10-04 LAB — CBC
Hemoglobin: 12.1 g/dL (ref 12.0–15.0)
MCH: 31.9 pg (ref 26.0–34.0)
MCV: 96 fL (ref 78.0–100.0)
RBC: 3.79 MIL/uL — ABNORMAL LOW (ref 3.87–5.11)

## 2011-10-04 NOTE — Telephone Encounter (Signed)
Returning answering service page from Ms. Rollins's niece Inetta Fermo. [name] was discharged today s/p pacemaker, and happen to note today that when she sneezed, it hurt in her pacemaker site. This happened about 2 more times. It doesn't hurt to the touch and doesn't hurt at rest. The pain goes away immediately post-sneeze. No inspirational pain. Reassured granddaughter that the pacemaker site may be tender for the next few days with this kind of activity, but let her know that if she happens to note pocket swelling/bleeding/redness/increased tenderness or different symptoms, to let our office know and we will arrange for a pacer site check sooner than scheduled. She expressed understanding and gratitude.

## 2011-10-05 NOTE — Discharge Summary (Addendum)
  [name], [name]             ACCOUNT NO.:  [id number]  MEDICAL RECORD NO.:  [id number]  LOCATION:  2021                         FACILITY:  [facility]  PHYSICIAN:  Madolyn Frieze. Jens Som, MD, FACCDATE OF BIRTH:  1930-06-07  DATE OF ADMISSION:  10/01/2011 DATE OF DISCHARGE:  10/04/2011                              DISCHARGE SUMMARY   ADDENDUM:  Upon dictating the discharge summary yesterday, I discovered that the patient's echo had resulted as the dictation was in progress. This had not been yet available for the discharging physician's read.  I reviewed the results of the echocardiogram, which included new LV dysfunction with an EF of 30% to 35% with diffuse hypokinesis with Dr. Jens Som, who wanted to keep the patient for another day.  She was started on ACE inhibitor for her left ventricular dysfunction and observed overnight.  Dr. Jens Som has seen and examined her today and feels she is stable for discharge.  The etiology of her new left ventricular dysfunction was unclear, question secondary to CAD or hypertension.  We will plan to continue aspirin, Coreg and lisinopril will be increased to 10 mg daily.  We will titrate medications as an outpatient.  Once her medications have been titrated and blood pressures are controlled, would obtain a repeat echocardiogram.  FEF was less than or equal to 35%, may need to upgrade the pacemaker.  The patient will follow up with Dr. Jens Som in addition to her device followup on November 08, 2011, at 11 a.m.  She will have a stress test at our office on October 15, 2011, to rule out ischemia as an underlying cause of her newly decreased EF.  Our office will also call her with an appointment with Dr. Ladona Ridgel for 3 months.  Discharge medication list remains the same as in the previously dictated discharge summary but with the addition of 10 mg of lisinopril daily.  DURATION OF DISCHARGE ENCOUNTER:  Greater than 30 minutes including physician PA  time.     Randy Whitener, P.A.C.   ______________________________ Madolyn Frieze. Jens Som, MD, [facility]    DD/MEDQ  D:  10/04/2011  T:  10/04/2011  Job:  [id number]  Electronically Signed by Olga Millers MD [facility] on 10/05/2011 10:10:07 AM Electronically Signed by Ronie Spies  on 10/14/2011 03:09:19 PM

## 2011-10-11 ENCOUNTER — Ambulatory Visit: Payer: Medicare Other | Admitting: *Deleted

## 2011-10-11 ENCOUNTER — Ambulatory Visit (INDEPENDENT_AMBULATORY_CARE_PROVIDER_SITE_OTHER): Payer: Medicare Other | Admitting: *Deleted

## 2011-10-11 ENCOUNTER — Encounter: Payer: Self-pay | Admitting: Internal Medicine

## 2011-10-11 ENCOUNTER — Other Ambulatory Visit (HOSPITAL_COMMUNITY): Payer: Self-pay | Admitting: Cardiology

## 2011-10-11 DIAGNOSIS — I442 Atrioventricular block, complete: Secondary | ICD-10-CM

## 2011-10-11 DIAGNOSIS — I498 Other specified cardiac arrhythmias: Secondary | ICD-10-CM

## 2011-10-11 LAB — PACEMAKER DEVICE OBSERVATION
ATRIAL PACING PM: 44
BAMS-0001: 150 {beats}/min
BAMS-0003: 70 {beats}/min
DEVICE MODEL PM: 7271891
RV LEAD IMPEDENCE PM: 537.5 Ohm
RV LEAD THRESHOLD: 0.75 V
VENTRICULAR PACING PM: 60

## 2011-10-11 NOTE — Op Note (Signed)
NAMELOURDES, KUCHARSKI NO.:  [id number]  MEDICAL RECORD NO.:  [id number]  LOCATION:  2504                         FACILITY:  [facility]  PHYSICIAN:  Doylene Canning. Ladona Ridgel, MD    DATE OF BIRTH:  1930/09/07  DATE OF PROCEDURE:  10/01/2011 DATE OF DISCHARGE:                              OPERATIVE REPORT   PROCEDURE PERFORMED:  Insertion of a dual-chamber pacemaker.  INDICATIONS:  Symptomatic complete heart block.  INTRODUCTION:  The patient is a 77 year old woman with a history of 3-4 weeks of fatigue and weakness.  She notes that these symptoms occurred several days after visiting her primary physician almost a month ago. She was subsequently found to have complete heart block with ventricular rates in the mid 30s on no AV nodal blocking drugs and is now referred for permanent pacemaker insertion.  PROCEDURE:  After informed consent was obtained, the patient was taken to the diagnostic EP lab in a fasting state.  After usual preparation and draping, intravenous fentanyl and midazolam was given for sedation. Lidocaine 30 mL was infiltrated into the left infraclavicular region.  A 5-cm incision was carried out over this region and electrocautery was utilized to dissect down to the fascial plane.  The left subclavian vein was punctured x2 and the St. Jude model 2088 T 52-cm active fixation pacing lead, serial number [id number] was advanced into the right ventricle.  The St. Jude model 2088 T 46-cm active fixation pacing lead, serial number [id number] was advanced into the right atrium.  Mapping was carried out in the right ventricle.  At the final site, the R-waves measured 6-7 mV, the pacing impedance was 800 ohms and a threshold was a volt at 0.5 milliseconds.  There was a large injury of current with active fixation of the lead.  With the ventricular lead in satisfactory position, attention was then turned to placement of the atrial lead which was placed in anterolateral  portion of the right atrium where P- waves measured 2 mV, the pace impedance was 360 ohms, and the threshold was 1 volt at 0.4 milliseconds.  With these satisfactory parameters, the leads were secured to the subpectoralis fascia with a figure-of-eight silk suture.  The sewing sleeve was secured with silk suture. Electrocautery was utilized to assure hemostasis and make a subcutaneous pocket.  The St. Jude Accent DR RF dual-chamber pacemaker, serial number [id number] was connected to the atrial and the ventricular leads and placed back in the subcutaneous pocket where it was secured with silk suture. The pocket was irrigated with antibiotic irrigation and the incision was closed with 2-0 and 3-0 Vicryl.  Benzoin and Steri-Strips were painted on the skin, pressure dressing was applied, and the patient was returned to her room in satisfactory condition.  COMPLICATIONS:  There were no immediate procedure complications.  RESULTS:  This demonstrate successful implantation of a St. Jude dual- chamber pacemaker in a patient with symptomatic complete heart block.     Doylene Canning. Ladona Ridgel, MD     GWT/MEDQ  D:  10/01/2011  T:  10/02/2011  Job:  [id number]  cc:   Madolyn Frieze. Jens Som, MD, [facility] Lonie Peak, PA  Electronically Signed by Lewayne Bunting  MD on 10/11/2011 06:49:49 PM

## 2011-10-11 NOTE — H&P (Signed)
[name], [name] NO.:  [id number]  MEDICAL RECORD NO.:  [id number]  LOCATION:  2504                         FACILITY:  [facility]  PHYSICIAN:  Rollene Rotunda, MD, FACCDATE OF BIRTH:  1930-01-04  DATE OF ADMISSION:  10/01/2011 DATE OF DISCHARGE:                             HISTORY & PHYSICAL   PRIMARY CARDIOLOGIST:  Madolyn Frieze. Jens Som, MD, [facility], previously seen in November 2007.  PRIMARY CARE PROVIDER:  Lonie Peak, PA at [facility].  PATIENT PROFILE:  A 77 year old female with prior negative cardiac workup who presents to the ED with 6-month history of fatigue and has been found to be in complete heart block.  PROBLEM LIST: 1. Symptomatic bradycardia/complete heart block. 2. History of pulmonary edema in the setting of rotator cuff surgery     November 2007.     a.     November 04, 2006, 2-D echocardiogram, EF 60-65%, no regional      wall motion abnormalities.     b.     Myoview November 05, 2006, small area of fixed apical      thinning, normal LV function without wall motion abnormality, no      ischemia. 3. Hypertension. 4. GERD. 5. Hypothyroidism. 6. History of left breast cancer     a.     Status post lumpectomy in 2003 with chemotherapy and      radiation. 7. History of right rotator cuff tear with attempted repair November     2007 aborted secondary to pulmonary edema. 8. Osteoarthritis.     a.     Status post left total hip arthroplasty August 02, 2010. 9. Hard of hearing. 10.History of vertigo. 11.Cataract surgery. 12.History of cholecystitis. 13.History of shingles approximately 1 month ago. 14.History of left thigh boil approximately 2-3 weeks ago.  ALLERGIES:  SULFA.  HISTORY OF PRESENT ILLNESS:  A 77 year old female with above problem list.  We last had contact with the patient in 2007 when she developed pulmonary edema during right rotator cuff repair complicated by bradycardia requiring medications which resulted in  tachycardia, hypertension, and subsequently pulmonary edema.  At that time, the patient underwent 2-D echocardiogram as well as Myoview stress testing which revealed a small area of apical thinning but no evidence of ischemia.  The patient had normal LV function.  The patient is followed closely by primary care provider in [facility] and over the past 2 months has been experiencing progressive fatigue and weakness as well as dyspnea on exertion.  She estimates that she has been seen approximately 5 or 6 times over the past 2 months by primary care provider.  Approximately 1 month ago, she was diagnosed with shingles manifested by lesions around her left scapular area.  This was subsequently treated and resolved.  Approximately 2-3 weeks ago, she noted a boil on her left thigh which was aspirated and apparently cultures were negative.  In the setting of these findings, the patient continued to have progressive weakness and fatigue as well as dyspnea on exertion.  Today, she was doing her usual morning activities and had profound fatigue and weakness complicated by presyncope.  She decided she would see primary care doctor and drove herself to  see him.  There, she was noted on vital signs to be bradycardic with rates in the 30s and subsequent ECG showed complete heart block.  EMS was called and the patient was taken to [facility] ED where she remained in complete heart block with heart rate of 34.  LABORATORY FINDINGS:  Notable for only mild hypokalemia.  Despite her symptoms over the past 2 months, she has never had any chest pain.  HOME MEDICATIONS: 1. Aspirin 81 mg daily. 2. Caltrate 600/200 daily. 3. Colace 100 mg daily. 4. Vitamin D2 2000 units daily. 5. Actonel 35 mg q. week. 6. Vicodin 5/500 p.r.n. 7. Prilosec 20 mg daily. 8. Synthroid 150 mcg daily.  FAMILY HISTORY:  Mother died of breast cancer.  She is not aware of her father's history.  She has no  siblings.  SOCIAL HISTORY:  The patient lives in [place] with her daughter.  She is retired and previously owned Manufacturing engineer.  She denies tobacco, alcohol, or drug use.  She is not routinely exercising secondary to fatigue.  REVIEW OF SYSTEMS:  Positive for fatigue, dyspnea on exertion, chronic constipation, and weakness.  Otherwise all systems reviewed and negative.  She is a full code.  PHYSICAL EXAMINATION:  VITAL SIGNS:  Temperature 97.8, heart rate 35, respirations 16, blood pressure 177/62, pulse ox 100% on room air. GENERAL:  Pleasant white female in no acute distress.  Awake, alert, and oriented x3.  She has a flattened affect. HEENT:  Normal.  Nares grossly intact and nonfocal. SKIN:  Warm and dry without lesions or masses. NECK:  Supple without bruits or JVD. LUNGS:  Respirations are regular and unlabored.  Clear to auscultation. CARDIAC:  Regular, bradycardic, distant S1, S2.  No S3, S4, or murmurs. ABDOMEN:  Round, soft, nontender, nondistended.  Bowel sounds present x4. EXTREMITIES:  Warm, dry, pink.  No clubbing, cyanosis or edema.  Distal pulses 1+ and equal bilaterally.  Chest x-ray is pending.  EKG shows complete heart block, rate of 35, right bundle-branch block, ST depression, and T-wave inversion in II, III, aVF, V3-V6.  Hemoglobin 11.4, hematocrit 35.1, WBC 4.9, platelets 145,000.  Sodium 141, potassium 3.6, chloride 107, CO2 23, BUN 28, creatinine 0.60, glucose 100, CK 72, MB 2.2.  ASSESSMENT/PLAN: 1. Symptomatic bradycardia/complete heart block.  The patient presents     with symptoms of weakness, fatigue as well as dyspnea exertion     times approximately 2 months.  She was last seen by primary care     provider about 1 week ago and the patient has no knowledge of     abnormal vital signs at that time.  She felt worse today and was     found to be in complete heart block.  ECG also shows more     pronounced inferolateral T-wave inversions compared to  older ECGs     and certainly would have to question whether or not fatigue and     dyspnea may have an anginal equivalent in the recent past.  ECG     shows no ST-segment elevation today and the patient has no active     chest pain.  That said, we will plan to admit the patient to cycle     cardiac markers which is so far negative.  There are no reversible     causes for her bradycardia looking at her chemistry.  We will check     TFTs as she apparently does not always take her Synthroid.  We  have     discussed the case with Dr. Ladona Ridgel in EP lab and we plan for     permanent pacemaker placement this afternoon. 2. Hypertension.  Blood pressures are elevated, however, we will leave     this to be in the setting of #1. 3. Hypothyroidism.  Check TFTs.  Continue Synthroid. 4. Hypokalemia mild.  Supplement now.     Nicolasa Ducking, ANP   ______________________________ Rollene Rotunda, MD, [facility]    CB/MEDQ  D:  10/01/2011  T:  10/01/2011  Job:  [id number]  Electronically Signed by Nicolasa Ducking ANP on 10/02/2011 06:16:31 PM Electronically Signed by Rollene Rotunda MD [facility] on 10/11/2011 01:39:55 PM

## 2011-10-11 NOTE — Progress Notes (Signed)
Pacer checked in clinic.

## 2011-10-15 ENCOUNTER — Ambulatory Visit (HOSPITAL_COMMUNITY): Payer: Medicare Other | Attending: Cardiology | Admitting: Radiology

## 2011-10-15 VITALS — Ht 60.0 in | Wt 110.0 lb

## 2011-10-15 DIAGNOSIS — R0609 Other forms of dyspnea: Secondary | ICD-10-CM | POA: Insufficient documentation

## 2011-10-15 DIAGNOSIS — R0989 Other specified symptoms and signs involving the circulatory and respiratory systems: Secondary | ICD-10-CM

## 2011-10-15 DIAGNOSIS — R55 Syncope and collapse: Secondary | ICD-10-CM

## 2011-10-15 DIAGNOSIS — I498 Other specified cardiac arrhythmias: Secondary | ICD-10-CM

## 2011-10-15 MED ORDER — REGADENOSON 0.4 MG/5ML IV SOLN: 0.4000 mg | Freq: Once | INTRAVENOUS | Status: DC

## 2011-10-15 MED ORDER — TECHNETIUM TC 99M TETROFOSMIN IV KIT
33.0000 | PACK | Freq: Once | INTRAVENOUS | Status: AC | PRN
  Administered 2011-10-15: 33 via INTRAVENOUS

## 2011-10-15 MED ORDER — ADENOSINE (DIAGNOSTIC) 3 MG/ML IV SOLN
0.5600 mg/kg | Freq: Once | INTRAVENOUS | Status: AC
  Administered 2011-10-15: 27.9 mg via INTRAVENOUS

## 2011-10-15 MED ORDER — TECHNETIUM TC 99M TETROFOSMIN IV KIT
11.0000 | PACK | Freq: Once | INTRAVENOUS | Status: AC | PRN
  Administered 2011-10-15: 11 via INTRAVENOUS

## 2011-10-15 NOTE — Progress Notes (Signed)
[facility] SITE 3 NUCLEAR MED [address] [place] [address] [phone]  Cardiology Nuclear Med Study  [name] is a 77 y.o. female [id number] September 09, 1930   Nuclear Med Background Indication for Stress Test:  Evaluation for Ischemia and 10/05/11 Post Hospital: Near syncope, CHB> Pacemaker, (-) enzymes History:  '07 [id number] area of fixed apical thinning, EF=60%; 10/01/11 PTVP; 10/02/11 Echo:EF=30-35% Cardiac Risk Factors: Hypertension and RBBB  Symptoms:  DOE, Fatigue, Fatigue with Exertion and Near Syncope   Nuclear Pre-Procedure Caffeine/Decaff Intake:  None NPO After: 9:30pm   Lungs:  Clear.  O2 SAT 96% on RA. IV 0.9% NS with Angio Cath:  20g  IV Site: L Antecubital x 1, tolerated well IV Started by:  Irean Hong, RN  Chest Size (in):  36 Cup Size: B  Height: 5' (1.524 m)  Weight:  110 lb (49.896 kg)  BMI:  Body mass index is 21.48 kg/(m^2). Tech Comments:  Held  Coreg this am     Nuclear Med Study 1 or 2 day study: 1 day  Stress Test Type:  Adenosine  Reading MD: Olga Millers, MD  Order Authorizing Provider:  Olga Millers, MD  Resting Radionuclide: Technetium 23m Tetrofosmin  Resting Radionuclide Dose: 11.0 mCi   Stress Radionuclide:  Technetium 16m Tetrofosmin  Stress Radionuclide Dose: 33.0 mCi           Stress Protocol Rest HR: 60 Stress HR: 81  Rest BP: 192/52 Stress BP: 146/79  Exercise Time (min): n/a METS: n/a   Predicted Max HR: 142 bpm % Max HR: 57.04 bpm Rate Pressure Product: 84696   Dose of Adenosine (mg):  n/a Dose of Lexiscan: 0.4 mg  Dose of Atropine (mg): n/a Dose of Dobutamine: n/a mcg/kg/min (at max HR)  Stress Test Technologist: Smiley Houseman, CMA-N  Nuclear Technologist:  Domenic Polite, CNMT     Rest Procedure:  Myocardial perfusion imaging was performed at rest 45 minutes following the intravenous administration of Technetium 69m Tetrofosmin.  Rest ECG: V. Paced.  Stress Procedure:  The patient  received IV adenosine at 140 mcg/kg/min for 4 minutes.  There were no diagnostic changes with infusion.  She did have intermittent RBBB.  Technetium 10m Tetrofosmin was injected at the 2 minute mark and quantitative spect images were obtained after a 45 minute delay.  Stress ECG: No significant ST segment change suggestive of ischemia.  QPS Raw Data Images:  Acquisition technically good; normal left ventricular size. Stress Images:  There is decreased uptake in the apex. Rest Images:  There is decreased uptake in the apex. Subtraction (SDS):  No evidence of ischemia. Transient Ischemic Dilatation (Normal <1.22):  0.97 Lung/Heart Ratio (Normal <0.45):  0.11  Quantitative Gated Spect Images QGS EDV:  68 ml QGS ESV:  26 ml QGS cine images:  NL LV Function; NL Wall Motion QGS EF: 62%  Impression Exercise Capacity:  Adenosine study with no exercise. BP Response:  Normal blood pressure response. Clinical Symptoms:  There is chest pain. ECG Impression:  No significant ST segment change suggestive of ischemia. Comparison with Prior Nuclear Study: Apical defect more prominent compared to previous  Overall Impression:  Low risk stress nuclear study with small fixed apical defect suggestive of small prior infarct vs thinning; no ischemia.   Olga Millers

## 2011-11-07 ENCOUNTER — Encounter: Payer: Self-pay | Admitting: *Deleted

## 2011-11-07 ENCOUNTER — Other Ambulatory Visit: Payer: Self-pay | Admitting: Oncology

## 2011-11-08 ENCOUNTER — Encounter: Payer: Self-pay | Admitting: Cardiology

## 2011-11-08 ENCOUNTER — Ambulatory Visit (INDEPENDENT_AMBULATORY_CARE_PROVIDER_SITE_OTHER): Payer: Medicare Other | Admitting: Cardiology

## 2011-11-08 VITALS — BP 182/98 | HR 66 | Ht 60.0 in | Wt 111.0 lb

## 2011-11-08 DIAGNOSIS — I10 Essential (primary) hypertension: Secondary | ICD-10-CM

## 2011-11-08 DIAGNOSIS — I519 Heart disease, unspecified: Secondary | ICD-10-CM

## 2011-11-08 DIAGNOSIS — I428 Other cardiomyopathies: Secondary | ICD-10-CM | POA: Insufficient documentation

## 2011-11-08 DIAGNOSIS — Z950 Presence of cardiac pacemaker: Secondary | ICD-10-CM

## 2011-11-08 NOTE — Patient Instructions (Signed)
Your physician recommends that you schedule a follow-up appointment in: AS NEEDED Your physician recommends that you continue on your current medications as directed. Please refer to the Current Medication list given to you today. Your physician has requested that you have an echocardiogram. Echocardiography is a painless test that uses sound waves to create images of your heart. It provides your doctor with information about the size and shape of your heart and how well your heart's chambers and valves are working. This procedure takes approximately one hour. There are no restrictions for this procedure. IN 8-12 WEEKS DX LV DYSFUNCTION

## 2011-11-08 NOTE — Assessment & Plan Note (Signed)
Patient's LV function was reduced on echocardiogram during the hospitalization. However followup Myoview showed improvement. Continue beta blocker and ACE inhibitor both for blood pressure and cardiomyopathy. Repeat echocardiogram in 8-12 weeks. If normalized no further evaluation.

## 2011-11-08 NOTE — Assessment & Plan Note (Signed)
Blood pressure is elevated. However she has not taken her medications today. We will follow this and increase medications as needed.

## 2011-11-08 NOTE — Progress Notes (Signed)
[id number] female admitted to [facility] in October of 2012 with complaints of progressive fatigue and dyspnea. Patient noted to be in complete heart block. She therefore had a pacemaker placed. She had an echocardiogram prior to discharge that revealed an ejection fraction of 30-35%, mild left atrial enlargement and mild mitral regurgitation/aortic insufficiency. TSH mildly elevated but free T4 normal. Patient started on meds for LV dysfunction. Outpatient myoview in Oct of 2012 showed EF 62, prior apical infarct vs thinning and no ischemia. Since she was discharged, the patient has dyspnea with more extreme activities but not with routine activities. It is relieved with rest. It is not associated with chest pain. There is no orthopnea, PND or pedal edema. There is no syncope or palpitations. There is no exertional chest pain. Her symptoms are improved.  Current Outpatient Prescriptions  Medication Sig Dispense Refill  . aspirin 81 MG tablet Take 81 mg by mouth daily.        . Calcium-Vitamin D (CALTRATE 600 PLUS-VIT D PO) Take 2 tablets by mouth daily.       . carvedilol (COREG) 6.25 MG tablet Take 6.25 mg by mouth 2 (two) times daily with a meal.        . cholecalciferol (VITAMIN D) 1000 UNITS tablet Take 2,000 Units by mouth daily.        . cyanocobalamin 2000 MCG tablet Take 2,000 mcg by mouth daily.        Marland Kitchen docusate sodium (COLACE) 100 MG capsule Take 100 mg by mouth daily.        Marland Kitchen gabapentin (NEURONTIN) 300 MG capsule Take 300 mg by mouth daily.        Marland Kitchen levothyroxine (SYNTHROID, LEVOTHROID) 150 MCG tablet Take 150 mcg by mouth daily.        Marland Kitchen lisinopril (PRINIVIL,ZESTRIL) 10 MG tablet Take 10 mg by mouth daily.        Marland Kitchen omeprazole (PRILOSEC) 20 MG capsule Take 40 mg by mouth daily.       . risedronate (ACTONEL) 35 MG tablet Take 35 mg by mouth every 7 (seven) days. with water on empty stomach, nothing by mouth or lie down for next 30 minutes.       Marland Kitchen zolpidem (AMBIEN) 5 MG tablet  Take 5 mg by mouth at bedtime as needed.           Past Medical History  Diagnosis Date  . GERD   . GASTRIC ULCER   . CONSTIPATION   . Breast cancer   . Osteoporosis   . HTN (hypertension)   . Hypothyroidism   . Vitamin D deficiency   . Arthritis   . Complete heart block     S/P pacemaker  . Cardiomyopathy     Past Surgical History  Procedure Date  . Cholecystectomy     History   Social History  . Marital Status: Widowed    Spouse Name: N/A    Number of Children: N/A  . Years of Education: N/A   Occupational History  . Not on file.   Social History Main Topics  . Smoking status: Never Smoker   . Smokeless tobacco: Not on file  . Alcohol Use: No  . Drug Use: No  . Sexually Active: Not on file   Other Topics Concern  . Not on file   Social History Narrative  . No narrative on file    ROS: no fevers or chills, productive cough, hemoptysis, dysphasia, odynophagia, melena, hematochezia, dysuria, hematuria, rash,  seizure activity, orthopnea, PND, pedal edema, claudication. Remaining systems are negative.  Physical Exam: Well-developed well-nourished in no acute distress.  Skin is warm and dry.  HEENT is normal.  Neck is supple. No thyromegaly.  Chest is clear to auscultation with normal expansion. Pacer site without evidence of infection. Cardiovascular exam is regular rate and rhythm.  Abdominal exam nontender or distended. No masses palpated. Extremities show no edema. neuro grossly intact    ECG-atrial paced rhythm, right bundle branch block, left ventricular hypertrophy and left anterior fascicular block.

## 2011-11-08 NOTE — Assessment & Plan Note (Signed)
Management per electrophysiology. The patient's left ventricular function has normalized on followup echocardiogram we will see the patient back as needed and she will followup with Dr. Ladona Ridgel for her pacemaker.

## 2011-11-15 ENCOUNTER — Other Ambulatory Visit (HOSPITAL_COMMUNITY): Payer: Medicare Other | Admitting: Radiology

## 2012-01-02 DIAGNOSIS — E039 Hypothyroidism, unspecified: Secondary | ICD-10-CM | POA: Diagnosis not present

## 2012-01-15 ENCOUNTER — Encounter: Payer: Self-pay | Admitting: Internal Medicine

## 2012-01-15 ENCOUNTER — Ambulatory Visit (HOSPITAL_COMMUNITY): Payer: Medicare Other | Attending: Cardiovascular Disease | Admitting: Radiology

## 2012-01-15 ENCOUNTER — Ambulatory Visit (INDEPENDENT_AMBULATORY_CARE_PROVIDER_SITE_OTHER): Payer: Medicare Other | Admitting: Internal Medicine

## 2012-01-15 DIAGNOSIS — I10 Essential (primary) hypertension: Secondary | ICD-10-CM | POA: Diagnosis not present

## 2012-01-15 DIAGNOSIS — Z950 Presence of cardiac pacemaker: Secondary | ICD-10-CM | POA: Diagnosis not present

## 2012-01-15 DIAGNOSIS — I519 Heart disease, unspecified: Secondary | ICD-10-CM

## 2012-01-15 DIAGNOSIS — I428 Other cardiomyopathies: Secondary | ICD-10-CM | POA: Diagnosis not present

## 2012-01-15 DIAGNOSIS — I442 Atrioventricular block, complete: Secondary | ICD-10-CM

## 2012-01-15 DIAGNOSIS — K219 Gastro-esophageal reflux disease without esophagitis: Secondary | ICD-10-CM | POA: Insufficient documentation

## 2012-01-15 LAB — PACEMAKER DEVICE OBSERVATION
AL AMPLITUDE: 3.7 mv
AL IMPEDENCE PM: 425 Ohm
AL THRESHOLD: 0.75 V
DEVICE MODEL PM: 7271891
RV LEAD AMPLITUDE: 4.7 mv
RV LEAD IMPEDENCE PM: 587.5 Ohm
RV LEAD THRESHOLD: 0.625 V

## 2012-01-15 NOTE — Assessment & Plan Note (Signed)
Her blood pressure is minimally elevated today. She will continue her current medical therapy, and maintain a low-sodium diet. 

## 2012-01-15 NOTE — Patient Instructions (Signed)
Your physician wants you to follow-up in: 09/2012 with Dr Court Joy will receive a reminder letter in the mail two months in advance. If you don't receive a letter, please call our office to schedule the follow-up appointment.

## 2012-01-15 NOTE — Assessment & Plan Note (Signed)
Her device is working normally. We'll plan to recheck in several months. 

## 2012-01-15 NOTE — Progress Notes (Signed)
HPI [name] returns today for followup. She is a very pleasant 78 year old woman with a history of symptomatic bradycardia and hypertension. She is status post permanent pacemaker insertion. She denies chest pain, peripheral edema, or shortness of breath. No syncope. Allergies  Allergen Reactions  . Pramipexole Dihydrochloride     REACTION: generic mirapex give hallucinations  . Sulfonamide Derivatives     REACTION: Rash     Current Outpatient Prescriptions  Medication Sig Dispense Refill  . aspirin 81 MG tablet Take 81 mg by mouth daily.        . Calcium-Vitamin D (CALTRATE 600 PLUS-VIT D PO) Take 2 tablets by mouth daily.       . carvedilol (COREG) 6.25 MG tablet Take 6.25 mg by mouth 2 (two) times daily with a meal.        . cholecalciferol (VITAMIN D) 1000 UNITS tablet Take 1,000 Units by mouth daily.       . cyanocobalamin 2000 MCG tablet Take 2,000 mcg by mouth daily.        Marland Kitchen docusate sodium (COLACE) 100 MG capsule Take 100 mg by mouth daily.        Marland Kitchen gabapentin (NEURONTIN) 600 MG tablet Take 600 mg by mouth daily.      Marland Kitchen levothyroxine (SYNTHROID, LEVOTHROID) 150 MCG tablet Take 150 mcg by mouth daily.        Marland Kitchen lisinopril (PRINIVIL,ZESTRIL) 10 MG tablet Take 10 mg by mouth daily.        Marland Kitchen omeprazole (PRILOSEC) 20 MG capsule Take 20 mg by mouth daily.       . risedronate (ACTONEL) 35 MG tablet Take 35 mg by mouth every 7 (seven) days. with water on empty stomach, nothing by mouth or lie down for next 30 minutes.       Marland Kitchen zolpidem (AMBIEN) 5 MG tablet Take 5 mg by mouth at bedtime as needed.           Past Medical History  Diagnosis Date  . GERD   . GASTRIC ULCER   . CONSTIPATION   . Breast cancer   . Osteoporosis   . HTN (hypertension)   . Hypothyroidism   . Vitamin D deficiency   . Arthritis   . Complete heart block     S/P pacemaker  . Cardiomyopathy     ROS:   All systems reviewed and negative except as noted in the HPI.   Past Surgical History    Procedure Date  . Cholecystectomy   . Doppler echocardiography 2007     Family History  Problem Relation Age of Onset  . Breast cancer       History   Social History  . Marital Status: Widowed    Spouse Name: N/A    Number of Children: N/A  . Years of Education: N/A   Occupational History  . Not on file.   Social History Main Topics  . Smoking status: Never Smoker   . Smokeless tobacco: Not on file  . Alcohol Use: No  . Drug Use: No  . Sexually Active: Not on file   Other Topics Concern  . Not on file   Social History Narrative  . No narrative on file     BP 134/80  Pulse 63  Ht 5' (1.524 m)  Wt 48.535 kg (107 lb)  BMI 20.90 kg/m2  SpO2 99%  Physical Exam:  Well appearing NAD HEENT: Unremarkable Neck:  No JVD, no thyromegally Lymphatics:  No adenopathy Back:  No CVA tenderness Lungs:  Clear HEART:  Regular rate rhythm, 1/6 systolic murmur, no rubs, no clicks Abd:  soft, positive bowel sounds, no organomegally, no rebound, no guarding Ext:  2 plus pulses, no edema, no cyanosis, no clubbing Skin:  No rashes no nodules Neuro:  CN II through XII intact, motor grossly intact  DEVICE  Normal device function.  See PaceArt for details.   Assess/Plan:

## 2012-01-15 NOTE — Assessment & Plan Note (Signed)
The patient has a 2-D echo pending. She will continue her ACE inhibitor and beta blocker.

## 2012-02-15 DIAGNOSIS — J069 Acute upper respiratory infection, unspecified: Secondary | ICD-10-CM | POA: Diagnosis not present

## 2012-03-18 DIAGNOSIS — Z1231 Encounter for screening mammogram for malignant neoplasm of breast: Secondary | ICD-10-CM | POA: Diagnosis not present

## 2012-04-14 DIAGNOSIS — M25559 Pain in unspecified hip: Secondary | ICD-10-CM | POA: Diagnosis not present

## 2012-04-29 DIAGNOSIS — G2581 Restless legs syndrome: Secondary | ICD-10-CM | POA: Diagnosis not present

## 2012-04-29 DIAGNOSIS — E039 Hypothyroidism, unspecified: Secondary | ICD-10-CM | POA: Diagnosis not present

## 2012-04-29 DIAGNOSIS — Z79899 Other long term (current) drug therapy: Secondary | ICD-10-CM | POA: Diagnosis not present

## 2012-04-29 DIAGNOSIS — M810 Age-related osteoporosis without current pathological fracture: Secondary | ICD-10-CM | POA: Diagnosis not present

## 2012-04-29 DIAGNOSIS — I10 Essential (primary) hypertension: Secondary | ICD-10-CM | POA: Diagnosis not present

## 2012-04-29 DIAGNOSIS — E559 Vitamin D deficiency, unspecified: Secondary | ICD-10-CM | POA: Diagnosis not present

## 2012-05-19 DIAGNOSIS — H26499 Other secondary cataract, unspecified eye: Secondary | ICD-10-CM | POA: Diagnosis not present

## 2012-06-09 ENCOUNTER — Other Ambulatory Visit (HOSPITAL_COMMUNITY): Payer: Self-pay | Admitting: Adult Health

## 2012-06-09 MED ORDER — LISINOPRIL 10 MG PO TABS: 10.0000 mg | ORAL_TABLET | Freq: Every day | ORAL | Status: DC

## 2012-06-09 MED ORDER — CARVEDILOL 6.25 MG PO TABS: 6.2500 mg | ORAL_TABLET | Freq: Two times a day (BID) | ORAL | Status: DC

## 2012-07-09 DIAGNOSIS — R5383 Other fatigue: Secondary | ICD-10-CM | POA: Diagnosis not present

## 2012-07-09 DIAGNOSIS — R5381 Other malaise: Secondary | ICD-10-CM | POA: Diagnosis not present

## 2012-07-10 DIAGNOSIS — R69 Illness, unspecified: Secondary | ICD-10-CM | POA: Diagnosis not present

## 2012-07-10 DIAGNOSIS — R4189 Other symptoms and signs involving cognitive functions and awareness: Secondary | ICD-10-CM | POA: Diagnosis not present

## 2012-07-10 DIAGNOSIS — R5381 Other malaise: Secondary | ICD-10-CM | POA: Diagnosis not present

## 2012-07-10 DIAGNOSIS — E538 Deficiency of other specified B group vitamins: Secondary | ICD-10-CM | POA: Diagnosis not present

## 2012-07-10 DIAGNOSIS — R718 Other abnormality of red blood cells: Secondary | ICD-10-CM | POA: Diagnosis not present

## 2012-08-05 DIAGNOSIS — B029 Zoster without complications: Secondary | ICD-10-CM | POA: Diagnosis not present

## 2012-08-05 DIAGNOSIS — L0100 Impetigo, unspecified: Secondary | ICD-10-CM | POA: Diagnosis not present

## 2012-10-07 ENCOUNTER — Encounter: Payer: Self-pay | Admitting: *Deleted

## 2012-10-14 ENCOUNTER — Encounter: Payer: Self-pay | Admitting: Internal Medicine

## 2012-10-14 ENCOUNTER — Ambulatory Visit (INDEPENDENT_AMBULATORY_CARE_PROVIDER_SITE_OTHER): Payer: Medicare Other | Admitting: Internal Medicine

## 2012-10-14 DIAGNOSIS — Z950 Presence of cardiac pacemaker: Secondary | ICD-10-CM

## 2012-10-14 DIAGNOSIS — I428 Other cardiomyopathies: Secondary | ICD-10-CM

## 2012-10-14 DIAGNOSIS — I10 Essential (primary) hypertension: Secondary | ICD-10-CM | POA: Diagnosis not present

## 2012-10-14 LAB — PACEMAKER DEVICE OBSERVATION
AL IMPEDENCE PM: 362.5 Ohm
ATRIAL PACING PM: 43
BAMS-0003: 70 {beats}/min
RV LEAD IMPEDENCE PM: 550 Ohm
VENTRICULAR PACING PM: 14

## 2012-10-14 NOTE — Patient Instructions (Signed)
Your physician wants you to follow-up in: 12 months with Dr. Taylor. You will receive a reminder letter in the mail two months in advance. If you don't receive a letter, please call our office to schedule the follow-up appointment.    

## 2012-10-14 NOTE — Progress Notes (Signed)
HPI Mrs. Henery returns today for followup. She is a very pleasant 78 year old woman with symptomatic bradycardia, status post permanent pacemaker insertion, hypertension, and a sedentary lifestyle. She is been stable in the interim, but she denies any exercise. She states that she stays active around her house. No syncope, chest pain, no shortness of breath. No peripheral edema. Allergies  Allergen Reactions  . Pramipexole Dihydrochloride     REACTION: generic mirapex give hallucinations  . Sulfonamide Derivatives     REACTION: Rash     Current Outpatient Prescriptions  Medication Sig Dispense Refill  . aspirin 81 MG tablet Take 81 mg by mouth daily.        . Calcium-Vitamin D (CALTRATE 600 PLUS-VIT D PO) Take 2 tablets by mouth daily.       . carvedilol (COREG) 6.25 MG tablet Take 1 tablet (6.25 mg total) by mouth 2 (two) times daily with a meal.  60 tablet  3  . cholecalciferol (VITAMIN D) 1000 UNITS tablet Take 1,000 Units by mouth daily.       . cyanocobalamin 2000 MCG tablet Take 2,000 mcg by mouth daily.        Marland Kitchen docusate sodium (COLACE) 100 MG capsule Take 100 mg by mouth daily.        Marland Kitchen gabapentin (NEURONTIN) 600 MG tablet Take 600 mg by mouth 2 (two) times daily.       Marland Kitchen levothyroxine (SYNTHROID, LEVOTHROID) 150 MCG tablet Take 150 mcg by mouth daily.        Marland Kitchen lisinopril (PRINIVIL,ZESTRIL) 10 MG tablet Take 1 tablet (10 mg total) by mouth daily.  30 tablet  3  . omeprazole (PRILOSEC) 20 MG capsule Take 20 mg by mouth daily.       . risedronate (ACTONEL) 35 MG tablet Take 35 mg by mouth every 7 (seven) days. with water on empty stomach, nothing by mouth or lie down for next 30 minutes.       Marland Kitchen zolpidem (AMBIEN) 5 MG tablet Take 5 mg by mouth at bedtime as needed.           Past Medical History  Diagnosis Date  . GERD   . GASTRIC ULCER   . CONSTIPATION   . Breast cancer   . Osteoporosis   . HTN (hypertension)   . Hypothyroidism   . Vitamin D deficiency   . Arthritis     . Complete heart block     S/P pacemaker  . Cardiomyopathy     ROS:   All systems reviewed and negative except as noted in the HPI.   Past Surgical History  Procedure Date  . Cholecystectomy   . Doppler echocardiography 2007     Family History  Problem Relation Age of Onset  . Breast cancer       History   Social History  . Marital Status: Widowed    Spouse Name: N/A    Number of Children: N/A  . Years of Education: N/A   Occupational History  . Not on file.   Social History Main Topics  . Smoking status: Never Smoker   . Smokeless tobacco: Not on file  . Alcohol Use: No  . Drug Use: No  . Sexually Active: Not on file   Other Topics Concern  . Not on file   Social History Narrative  . No narrative on file     BP 116/82  Pulse 72  Ht 4' 11.5" (1.511 m)  Wt 107 lb 12.8 oz (48.898  kg)  BMI 21.41 kg/m2  SpO2 98%  Physical Exam:  Well appearing elderly woman, NAD HEENT: Unremarkable Neck:  No JVD, no thyromegally Lungs:  Clear with no wheezes, rales, or rhonchi. HEART:  Regular rate rhythm, no murmurs, no rubs, no clicks Abd:  soft, positive bowel sounds, no organomegally, no rebound, no guarding Ext:  2 plus pulses, no edema, no cyanosis, no clubbing Skin:  No rashes no nodules Neuro:  CN II through XII intact, motor grossly intact  DEVICE  Normal device function.  See PaceArt for details.   Assess/Plan:

## 2012-10-14 NOTE — Assessment & Plan Note (Signed)
Her blood pressure is well controlled. She is instructed to maintain a low-sodium diet and continue her current medical therapy.

## 2012-10-14 NOTE — Assessment & Plan Note (Signed)
Her [employer] Jude dual-chamber pacemaker is working normally. We'll plan to recheck in several months. Today we have reprogrammed her AV delays to allow for intrinsic conduction.

## 2012-10-29 DIAGNOSIS — E039 Hypothyroidism, unspecified: Secondary | ICD-10-CM | POA: Diagnosis not present

## 2012-10-29 DIAGNOSIS — G2581 Restless legs syndrome: Secondary | ICD-10-CM | POA: Diagnosis not present

## 2012-10-29 DIAGNOSIS — I10 Essential (primary) hypertension: Secondary | ICD-10-CM | POA: Diagnosis not present

## 2012-10-29 DIAGNOSIS — E559 Vitamin D deficiency, unspecified: Secondary | ICD-10-CM | POA: Diagnosis not present

## 2012-10-29 DIAGNOSIS — Z23 Encounter for immunization: Secondary | ICD-10-CM | POA: Diagnosis not present

## 2012-12-01 ENCOUNTER — Telehealth: Payer: Self-pay | Admitting: Internal Medicine

## 2012-12-01 NOTE — Telephone Encounter (Signed)
Pt calling re questions re transmitter pls call [phone]

## 2012-12-01 NOTE — Telephone Encounter (Signed)
Spoke w/pt and number for tech services was given. Pt to call to get help.

## 2012-12-02 ENCOUNTER — Other Ambulatory Visit (HOSPITAL_COMMUNITY): Payer: Self-pay | Admitting: Adult Health

## 2012-12-02 DIAGNOSIS — E039 Hypothyroidism, unspecified: Secondary | ICD-10-CM | POA: Diagnosis not present

## 2012-12-02 DIAGNOSIS — E559 Vitamin D deficiency, unspecified: Secondary | ICD-10-CM | POA: Diagnosis not present

## 2012-12-06 ENCOUNTER — Other Ambulatory Visit (HOSPITAL_COMMUNITY): Payer: Self-pay | Admitting: Adult Health

## 2013-01-19 ENCOUNTER — Encounter: Payer: Medicare Other | Admitting: *Deleted

## 2013-01-20 ENCOUNTER — Encounter: Payer: Self-pay | Admitting: *Deleted

## 2013-03-19 DIAGNOSIS — Z1231 Encounter for screening mammogram for malignant neoplasm of breast: Secondary | ICD-10-CM | POA: Diagnosis not present

## 2013-04-03 DIAGNOSIS — E538 Deficiency of other specified B group vitamins: Secondary | ICD-10-CM | POA: Diagnosis not present

## 2013-04-03 DIAGNOSIS — E039 Hypothyroidism, unspecified: Secondary | ICD-10-CM | POA: Diagnosis not present

## 2013-04-03 DIAGNOSIS — E559 Vitamin D deficiency, unspecified: Secondary | ICD-10-CM | POA: Diagnosis not present

## 2013-04-03 DIAGNOSIS — G4700 Insomnia, unspecified: Secondary | ICD-10-CM | POA: Diagnosis not present

## 2013-04-03 DIAGNOSIS — G2581 Restless legs syndrome: Secondary | ICD-10-CM | POA: Diagnosis not present

## 2013-04-10 ENCOUNTER — Ambulatory Visit (INDEPENDENT_AMBULATORY_CARE_PROVIDER_SITE_OTHER): Payer: Medicare Other | Admitting: Cardiology

## 2013-04-10 ENCOUNTER — Encounter: Payer: Self-pay | Admitting: Cardiology

## 2013-04-10 VITALS — BP 135/81 | HR 67 | Ht 60.0 in | Wt 104.0 lb

## 2013-04-10 DIAGNOSIS — I442 Atrioventricular block, complete: Secondary | ICD-10-CM

## 2013-04-10 DIAGNOSIS — Z950 Presence of cardiac pacemaker: Secondary | ICD-10-CM

## 2013-04-10 LAB — PACEMAKER DEVICE OBSERVATION
AL AMPLITUDE: 2.7 mv
AL IMPEDENCE PM: 360 Ohm
BAMS-0001: 150 {beats}/min
BATTERY VOLTAGE: 2.95 V
RV LEAD AMPLITUDE: 3.8 mv
RV LEAD IMPEDENCE PM: 490 Ohm
VENTRICULAR PACING PM: 0

## 2013-04-10 NOTE — Progress Notes (Signed)
ELECTROPHYSIOLOGY OFFICE NOTE  Patient ID: [name] MRN: [id number], DOB/AGE: 77-Sep-1931   Date of Visit: 04/10/2013  Primary Physician: Arlyss Queen Primary EP: Ladona Ridgel, MD Reason for Visit: EP/device follow-up; "I think I heard beeping"  History of Present Illness  [name] is a pleasant 79 year old woman with CHB s/p PPM implant who presents today for routine electrophysiology followup. Since last being seen in our clinic, she reports she is doing well. Her only concern today is a possible noise coming from her PPM. She states yesterday she heard "bells ringing" and was worried it was an alert from her device. Otherwise, she has no complaints. She specifically denies chest pain or shortness of breath. She denies palpitations, dizziness, near syncope or syncope. She denies LE swelling, orthopnea, PND or recent weight gain. [name] reports that she is compliant and tolerating medications without difficulty.  Past Medical History Past Medical History  Diagnosis Date  . GERD   . GASTRIC ULCER   . CONSTIPATION   . Breast cancer   . Osteoporosis   . HTN (hypertension)   . Hypothyroidism   . Vitamin D deficiency   . Arthritis   . Complete heart block     S/P pacemaker  . Cardiomyopathy     Past Surgical History Past Surgical History  Procedure Laterality Date  . Cholecystectomy    . Doppler echocardiography  2007     Allergies/Intolerances Allergies  Allergen Reactions  . Pramipexole Dihydrochloride     REACTION: generic mirapex give hallucinations  . Sulfonamide Derivatives     REACTION: Rash    Current Home Medications Current Outpatient Prescriptions  Medication Sig Dispense Refill  . aspirin 81 MG tablet Take 81 mg by mouth daily.        . Calcium-Vitamin D (CALTRATE 600 PLUS-VIT D PO) Take 2 tablets by mouth daily.       . carvedilol (COREG) 6.25 MG tablet TAKE 1 TABLET BY MOUTH TWICE A DAY WITH A MEAL  60 tablet  3  .  cholecalciferol (VITAMIN D) 1000 UNITS tablet Take 1,000 Units by mouth daily.       . cyanocobalamin 2000 MCG tablet Take 2,000 mcg by mouth daily.        Marland Kitchen docusate sodium (COLACE) 100 MG capsule Take 100 mg by mouth daily.        Marland Kitchen gabapentin (NEURONTIN) 600 MG tablet Take 600 mg by mouth 2 (two) times daily.       Marland Kitchen levothyroxine (SYNTHROID, LEVOTHROID) 150 MCG tablet Take 75 mcg by mouth daily.       Marland Kitchen lisinopril (PRINIVIL,ZESTRIL) 10 MG tablet TAKE 1 TABLET BY MOUTH DAILY  30 tablet  3  . omeprazole (PRILOSEC) 20 MG capsule Take 20 mg by mouth daily.       . risedronate (ACTONEL) 35 MG tablet Take 35 mg by mouth every 7 (seven) days. with water on empty stomach, nothing by mouth or lie down for next 30 minutes.        No current facility-administered medications for this visit.    Social History Social History  . Marital Status: Widowed   Social History Main Topics  . Smoking status: Never Smoker   . Smokeless tobacco: No  . Alcohol Use: No  . Drug Use: No   Review of Systems General: No chills, fever, night sweats or weight changes Cardiovascular: No chest pain, dyspnea on exertion, edema, orthopnea, palpitations, paroxysmal nocturnal dyspnea Dermatological: No rash, lesions  or masses Respiratory: No cough, dyspnea Urologic: No hematuria, dysuria Abdominal: No nausea, vomiting, diarrhea, bright red blood per rectum, melena, or hematemesis Neurologic: No visual changes, weakness, changes in mental status All other systems reviewed and are otherwise negative except as noted above.  Physical Exam Blood pressure 135/81, pulse 67, height 5' (1.524 m), weight 104 lb (47.174 kg).  General: Well developed, well appearing 79 year old female in no acute distress. HEENT: Normocephalic, atraumatic. EOMs intact. Sclera nonicteric. Oropharynx clear.  Neck: Supple. No JVD. Lungs: Respirations regular and unlabored, CTA bilaterally. No wheezes, rales or rhonchi. Heart: RRR. S1, S2  present. No murmurs, rub, S3 or S4. Abdomen: Soft, non-distended.   Extremities: No clubbing, cyanosis or edema. PT/radials 2+ and equal bilaterally. Psych: Normal affect. Neuro: Alert and oriented X 3. Moves all extremities spontaneously.   Diagnostics Device interrogation today - Normal device function. No alerts. Thresholds, sensing, impedances consistent with previous measurements. Device programmed to maximize longevity. 18 mode switches, <1% of time, longest 14 seconds, AT/AF burden <1%. No high ventricular rates noted. Device programmed at appropriate safety margins. Histogram distribution appropriate for patient activity level. Device programmed to optimize intrinsic conduction. Estimated longevity 7.9 - 8.2 years  Assessment and Plan 1. CHB s/p PPM implant Normal device function No alerts No programming changes made Return for routine follow-up and device check with Dr. Ladona Ridgel in 6 months  Signed, Rick Duff, PA-C 04/10/2013, 3:56 PM

## 2013-04-12 ENCOUNTER — Encounter: Payer: Self-pay | Admitting: Cardiology

## 2013-04-14 DIAGNOSIS — IMO0002 Reserved for concepts with insufficient information to code with codable children: Secondary | ICD-10-CM | POA: Diagnosis not present

## 2013-04-14 DIAGNOSIS — R21 Rash and other nonspecific skin eruption: Secondary | ICD-10-CM | POA: Diagnosis not present

## 2013-04-14 DIAGNOSIS — Z9181 History of falling: Secondary | ICD-10-CM | POA: Diagnosis not present

## 2013-04-14 DIAGNOSIS — Z1331 Encounter for screening for depression: Secondary | ICD-10-CM | POA: Diagnosis not present

## 2013-04-15 ENCOUNTER — Other Ambulatory Visit (HOSPITAL_COMMUNITY): Payer: Self-pay | Admitting: Internal Medicine

## 2013-04-30 ENCOUNTER — Encounter: Payer: Self-pay | Admitting: Internal Medicine

## 2013-05-20 DIAGNOSIS — H353190 Nonexudative age-related macular degeneration, unspecified eye, stage unspecified: Secondary | ICD-10-CM | POA: Diagnosis not present

## 2013-05-29 DIAGNOSIS — R209 Unspecified disturbances of skin sensation: Secondary | ICD-10-CM | POA: Diagnosis not present

## 2013-05-29 DIAGNOSIS — G2581 Restless legs syndrome: Secondary | ICD-10-CM | POA: Diagnosis not present

## 2013-05-29 DIAGNOSIS — E039 Hypothyroidism, unspecified: Secondary | ICD-10-CM | POA: Diagnosis not present

## 2013-05-29 DIAGNOSIS — E559 Vitamin D deficiency, unspecified: Secondary | ICD-10-CM | POA: Diagnosis not present

## 2013-07-11 ENCOUNTER — Other Ambulatory Visit (HOSPITAL_COMMUNITY): Payer: Self-pay | Admitting: Internal Medicine

## 2013-07-20 ENCOUNTER — Telehealth: Payer: Self-pay | Admitting: Internal Medicine

## 2013-07-29 DIAGNOSIS — E039 Hypothyroidism, unspecified: Secondary | ICD-10-CM | POA: Diagnosis not present

## 2013-07-29 DIAGNOSIS — G2581 Restless legs syndrome: Secondary | ICD-10-CM | POA: Diagnosis not present

## 2013-07-29 DIAGNOSIS — R002 Palpitations: Secondary | ICD-10-CM | POA: Diagnosis not present

## 2013-07-29 DIAGNOSIS — R5381 Other malaise: Secondary | ICD-10-CM | POA: Diagnosis not present

## 2013-08-05 ENCOUNTER — Ambulatory Visit (INDEPENDENT_AMBULATORY_CARE_PROVIDER_SITE_OTHER): Payer: Medicare Other | Admitting: *Deleted

## 2013-08-05 ENCOUNTER — Encounter: Payer: Self-pay | Admitting: Physician Assistant

## 2013-08-05 ENCOUNTER — Ambulatory Visit (INDEPENDENT_AMBULATORY_CARE_PROVIDER_SITE_OTHER): Payer: Medicare Other | Admitting: Physician Assistant

## 2013-08-05 VITALS — BP 120/70 | HR 73 | Ht 60.0 in | Wt 102.0 lb

## 2013-08-05 DIAGNOSIS — Z950 Presence of cardiac pacemaker: Secondary | ICD-10-CM

## 2013-08-05 DIAGNOSIS — R0609 Other forms of dyspnea: Secondary | ICD-10-CM | POA: Diagnosis not present

## 2013-08-05 DIAGNOSIS — I442 Atrioventricular block, complete: Secondary | ICD-10-CM | POA: Diagnosis not present

## 2013-08-05 DIAGNOSIS — I10 Essential (primary) hypertension: Secondary | ICD-10-CM

## 2013-08-05 DIAGNOSIS — R0600 Dyspnea, unspecified: Secondary | ICD-10-CM

## 2013-08-05 DIAGNOSIS — I428 Other cardiomyopathies: Secondary | ICD-10-CM

## 2013-08-05 DIAGNOSIS — R0989 Other specified symptoms and signs involving the circulatory and respiratory systems: Secondary | ICD-10-CM

## 2013-08-05 LAB — PACEMAKER DEVICE OBSERVATION
AL AMPLITUDE: 2.1 mv
BAMS-0001: 150 {beats}/min
BAMS-0003: 70 {beats}/min
BATTERY VOLTAGE: 2.9478 V
DEVICE MODEL PM: 7271891
RV LEAD THRESHOLD: 0.625 V
VENTRICULAR PACING PM: 1

## 2013-08-05 NOTE — Assessment & Plan Note (Signed)
Patient complains of several week history of dyspnea on exertion and weakness. There is no evidence of heart failure on exam today. Her pacer check shows that she is pacing less than 50% of the time and had some PACs and atrial tachycardia less than 1% of the time. She tells me now that she is just anxious because she was unable to check her pacemaker on home and was worried it wasn't working properly. I reassured her that she gets checked regularly here and it is working normally. We will check a 2-D echo to make sure there is no change in LV function. She will followup with Dr. Taylor in 2-3 months. 

## 2013-08-05 NOTE — Progress Notes (Signed)
Pt requesting device check due to feeling less energetic.  Inc'd V sensitivity from 2.27mV to 1.28mV, all other functions normal.  Pt using device 49% of time for Atrial pacing, 1% for V pacing. Mode switches account for <1% of time. No other changes made, full details in PaceArt.   Pt requesting only OV, no longer has Merlin.  ROV w/ Dr. Ladona Ridgel 11/13/13 @10 :00am

## 2013-08-05 NOTE — Assessment & Plan Note (Signed)
Patient complains of several week history of dyspnea on exertion. Last echo was in 2013 EF 50%. We will repeat 2-D echo to make sure there is no change in her LV function.

## 2013-08-05 NOTE — Patient Instructions (Addendum)
Your physician recommends that you continue on your current medications as directed. Please refer to the Current Medication list given to you today.  Your physician has requested that you have an echocardiogram. Echocardiography is a painless test that uses sound waves to create images of your heart. It provides your doctor with information about the size and shape of your heart and how well your heart's chambers and valves are working. This procedure takes approximately one hour. There are no restrictions for this procedure.  Your physician recommends that you schedule a follow-up appointment in: 3 months with Dr. Ladona Ridgel

## 2013-08-05 NOTE — Progress Notes (Signed)
HPI:  This is an 79 year old patient Dr. Ladona Ridgel who has history of complete heart block status post permanent pacemaker. She is seen regularly in our device clinic. 2-D echo in January 2013 showed normal LV function ejection fraction 50% with trivial AI. She had a low risk nuclear study with small fixed apical defect suggestive of small prior infarct versus bending but no ischemia in October 2012.  The patient comes in today complaining of several week history of dyspnea with little activities such as sweeping or doing dishes. She is also very weak. She says it feels like just before she got her pacemaker. She denies any chest pain, orthopnea, edema, dizziness, or presyncope, or palpitations. She saw her primary M.D. last week who did blood work including a TSH and told her it was all normal.   Allergies: - Pramipexole Dihydrochloride    --  REACTION: generic mirapex give hallucinations  -- Sulfonamide Derivatives    --  REACTION: Rash  Current Outpatient Prescriptions on File Prior to Visit: aspirin 81 MG tablet, Take 81 mg by mouth daily.  , Disp: , Rfl:  Calcium-Vitamin D (CALTRATE 600 PLUS-VIT D PO), Take 2 tablets by mouth daily. , Disp: , Rfl:  carvedilol (COREG) 6.25 MG tablet, TAKE 1 TABLET BY MOUTH TWICE A DAY WITH A MEAL, Disp: 60 tablet, Rfl: 3 cholecalciferol (VITAMIN D) 1000 UNITS tablet, Take 1,000 Units by mouth daily. , Disp: , Rfl:  cyanocobalamin 2000 MCG tablet, Take 2,000 mcg by mouth daily.  , Disp: , Rfl:  docusate sodium (COLACE) 100 MG capsule, Take 100 mg by mouth daily.  , Disp: , Rfl:  gabapentin (NEURONTIN) 600 MG tablet, Take 600 mg by mouth 2 (two) times daily. , Disp: , Rfl:  levothyroxine (SYNTHROID, LEVOTHROID) 150 MCG tablet, Take 75 mcg by mouth daily. , Disp: , Rfl:  lisinopril (PRINIVIL,ZESTRIL) 10 MG tablet, TAKE 1 TABLET BY MOUTH EVERY DAY, Disp: 30 tablet, Rfl: 3 omeprazole (PRILOSEC) 20 MG capsule, Take 20 mg by mouth daily. , Disp: , Rfl:  risedronate  (ACTONEL) 35 MG tablet, Take 35 mg by mouth every 7 (seven) days. with water on empty stomach, nothing by mouth or lie down for next 30 minutes. , Disp: , Rfl:   No current facility-administered medications on file prior to visit.   Past Medical History:   GERD                                                         GASTRIC ULCER                                                CONSTIPATION                                                 Breast cancer  Osteoporosis                                                 HTN (hypertension)                                           Hypothyroidism                                               Vitamin D deficiency                                         Arthritis                                                    Complete heart block                                           Comment:S/P pacemaker   Cardiomyopathy                                              Past Surgical History:   CHOLECYSTECTOMY                                               DOPPLER ECHOCARDIOGRAPHY                         2007        Review of patient's family history indicates:   Breast cancer                                           Social History   Marital Status: Widowed             Spouse Name:                      Years of Education:                 Number of children:             Occupational History   None on file  Social History Main Topics   Smoking Status: Never Smoker                     Smokeless Status: Not on file  Alcohol Use: No             Drug Use: No             Sexual Activity: Not on file        Other Topics            Concern   None on file  Social History Narrative   None on file    ROS: Trouble sleeping, See history of present illness otherwise negative   PHYSICAL EXAM: Well-nournished, in no acute distress. Neck: No JVD, HJR, Bruit, or thyroid enlargement  Lungs: No  tachypnea, clear without wheezing, rales, or rhonchi  Cardiovascular: RRR, PMI not displaced, heart sounds normal, no murmurs, gallops, bruit, thrill, or heave.  Abdomen: BS normal. Soft without organomegaly, masses, lesions or tenderness.  Extremities: without cyanosis, clubbing or edema. Good distal pulses bilateral  SKin: Warm, no lesions or rashes   Musculoskeletal: No deformities  Neuro: no focal signs  BP 120/70  Pulse 73  Ht 5' (1.524 m)  Wt 102 lb (46.267 kg)  BMI 19.92 kg/m2    EKG: Normal sinus rhythm with right bundle branch block left anterior fascicular block, bifascicular block  2Decho 01/15/12: Study Conclusions  - Left ventricle: The cavity size was normal. Wall thickness   was normal. The estimated ejection fraction was 50%. - Aortic valve: Trivial regurgitation. - Left atrium: The atrium was mildly dilated. - Atrial septum: No defect or patent foramen ovale was   identified.  Stress myoview 10/15/11: Overall Impression:  Low risk stress nuclear study with small fixed apical defect suggestive of small prior infarct vs thinning; no ischemia.

## 2013-08-05 NOTE — Assessment & Plan Note (Signed)
Patient complains of several week history of dyspnea on exertion and weakness. There is no evidence of heart failure on exam today. Her pacer check shows that she is pacing less than 50% of the time and had some PACs and atrial tachycardia less than 1% of the time. She tells me now that she is just anxious because she was unable to check her pacemaker on home and was worried it wasn't working properly. I reassured her that she gets checked regularly here and it is working normally. We will check a 2-D echo to make sure there is no change in LV function. She will followup with Dr. Ladona Ridgel in 2-3 months.

## 2013-08-20 ENCOUNTER — Other Ambulatory Visit (HOSPITAL_COMMUNITY): Payer: Self-pay | Admitting: Internal Medicine

## 2013-08-20 ENCOUNTER — Other Ambulatory Visit (HOSPITAL_COMMUNITY): Payer: Medicare Other

## 2013-08-27 ENCOUNTER — Encounter: Payer: Self-pay | Admitting: Internal Medicine

## 2013-09-01 DIAGNOSIS — G2581 Restless legs syndrome: Secondary | ICD-10-CM | POA: Diagnosis not present

## 2013-09-01 DIAGNOSIS — E039 Hypothyroidism, unspecified: Secondary | ICD-10-CM | POA: Diagnosis not present

## 2013-09-01 DIAGNOSIS — I10 Essential (primary) hypertension: Secondary | ICD-10-CM | POA: Diagnosis not present

## 2013-09-08 ENCOUNTER — Ambulatory Visit (HOSPITAL_COMMUNITY): Payer: Medicare Other | Attending: Physician Assistant

## 2013-09-08 DIAGNOSIS — I379 Nonrheumatic pulmonary valve disorder, unspecified: Secondary | ICD-10-CM | POA: Diagnosis not present

## 2013-09-08 DIAGNOSIS — R0989 Other specified symptoms and signs involving the circulatory and respiratory systems: Secondary | ICD-10-CM | POA: Insufficient documentation

## 2013-09-08 DIAGNOSIS — I428 Other cardiomyopathies: Secondary | ICD-10-CM | POA: Diagnosis not present

## 2013-09-08 DIAGNOSIS — R0602 Shortness of breath: Secondary | ICD-10-CM

## 2013-09-08 DIAGNOSIS — I10 Essential (primary) hypertension: Secondary | ICD-10-CM | POA: Diagnosis not present

## 2013-09-08 DIAGNOSIS — I079 Rheumatic tricuspid valve disease, unspecified: Secondary | ICD-10-CM | POA: Diagnosis not present

## 2013-09-08 DIAGNOSIS — R0609 Other forms of dyspnea: Secondary | ICD-10-CM | POA: Diagnosis not present

## 2013-09-08 DIAGNOSIS — R0600 Dyspnea, unspecified: Secondary | ICD-10-CM

## 2013-09-08 DIAGNOSIS — I080 Rheumatic disorders of both mitral and aortic valves: Secondary | ICD-10-CM | POA: Diagnosis not present

## 2013-09-08 NOTE — Progress Notes (Signed)
Echocardiogram performed.  

## 2013-11-02 DIAGNOSIS — N390 Urinary tract infection, site not specified: Secondary | ICD-10-CM | POA: Diagnosis not present

## 2013-11-02 DIAGNOSIS — G2581 Restless legs syndrome: Secondary | ICD-10-CM | POA: Diagnosis not present

## 2013-11-02 DIAGNOSIS — E559 Vitamin D deficiency, unspecified: Secondary | ICD-10-CM | POA: Diagnosis not present

## 2013-11-02 DIAGNOSIS — Z23 Encounter for immunization: Secondary | ICD-10-CM | POA: Diagnosis not present

## 2013-11-02 DIAGNOSIS — I10 Essential (primary) hypertension: Secondary | ICD-10-CM | POA: Diagnosis not present

## 2013-11-02 DIAGNOSIS — E039 Hypothyroidism, unspecified: Secondary | ICD-10-CM | POA: Diagnosis not present

## 2013-11-13 ENCOUNTER — Encounter: Payer: Medicare Other | Admitting: Internal Medicine

## 2013-12-08 ENCOUNTER — Encounter: Payer: Medicare Other | Admitting: Internal Medicine

## 2014-01-08 ENCOUNTER — Ambulatory Visit (INDEPENDENT_AMBULATORY_CARE_PROVIDER_SITE_OTHER): Payer: Medicare Other | Admitting: Internal Medicine

## 2014-01-08 ENCOUNTER — Encounter: Payer: Self-pay | Admitting: Internal Medicine

## 2014-01-08 VITALS — BP 154/88 | HR 64 | Ht 60.0 in | Wt 103.0 lb

## 2014-01-08 DIAGNOSIS — I428 Other cardiomyopathies: Secondary | ICD-10-CM

## 2014-01-08 DIAGNOSIS — I10 Essential (primary) hypertension: Secondary | ICD-10-CM | POA: Diagnosis not present

## 2014-01-08 DIAGNOSIS — Z950 Presence of cardiac pacemaker: Secondary | ICD-10-CM | POA: Diagnosis not present

## 2014-01-08 LAB — MDC_IDC_ENUM_SESS_TYPE_INCLINIC
Battery Voltage: 2.95 V
Brady Statistic RA Percent Paced: 33 %
Brady Statistic RV Percent Paced: 0.26 %
Date Time Interrogation Session: 20150109123133
Lead Channel Impedance Value: 337.5 Ohm
Lead Channel Pacing Threshold Pulse Width: 0.4 ms
Lead Channel Pacing Threshold Pulse Width: 0.4 ms
Lead Channel Sensing Intrinsic Amplitude: 2.5 mV
Lead Channel Setting Pacing Amplitude: 0.875
Lead Channel Setting Pacing Amplitude: 1.75 V
Lead Channel Setting Sensing Sensitivity: 1 mV
MDC IDC MSMT BATTERY REMAINING LONGEVITY: 106.8 mo
MDC IDC MSMT LEADCHNL RA PACING THRESHOLD AMPLITUDE: 0.75 V
MDC IDC MSMT LEADCHNL RV IMPEDANCE VALUE: 450 Ohm
MDC IDC MSMT LEADCHNL RV PACING THRESHOLD AMPLITUDE: 0.625 V
MDC IDC MSMT LEADCHNL RV SENSING INTR AMPL: 2.9 mV
MDC IDC PG SERIAL: 7271891
MDC IDC SET LEADCHNL RV PACING PULSEWIDTH: 0.4 ms

## 2014-01-08 NOTE — Progress Notes (Signed)
HPI [name] returns today for followup. She is a very pleasant 80 year old woman with symptomatic bradycardia, status post permanent pacemaker insertion, hypertension, and a sedentary lifestyle. She is been stable in the interim, but she denies any exercise. She states that she stays active around her house. No syncope, chest pain, no shortness of breath. No peripheral edema. She has been bothered by musculoskeletal pain in her left back. It is improved with heating pad. Allergies  Allergen Reactions  . Pramipexole Dihydrochloride     REACTION: generic mirapex give hallucinations  . Sulfonamide Derivatives     REACTION: Rash     Current Outpatient Prescriptions  Medication Sig Dispense Refill  . aspirin 81 MG tablet Take 81 mg by mouth daily.        . Calcium-Vitamin D (CALTRATE 600 PLUS-VIT D PO) Take 2 tablets by mouth daily.       . carvedilol (COREG) 6.25 MG tablet TAKE 1 TABLET BY MOUTH TWICE A DAY WITH A MEAL  60 tablet  3  . cholecalciferol (VITAMIN D) 1000 UNITS tablet Take 1,000 Units by mouth daily.       . cyanocobalamin 2000 MCG tablet Take 2,000 mcg by mouth daily.        Marland Kitchen docusate sodium (COLACE) 100 MG capsule Take 100 mg by mouth daily.        Marland Kitchen gabapentin (NEURONTIN) 600 MG tablet Take 600 mg by mouth 2 (two) times daily.       Marland Kitchen levothyroxine (SYNTHROID, LEVOTHROID) 150 MCG tablet Take 75 mcg by mouth daily.       Marland Kitchen lisinopril (PRINIVIL,ZESTRIL) 10 MG tablet TAKE 1 TABLET BY MOUTH EVERY DAY  90 tablet  3  . omeprazole (PRILOSEC) 20 MG capsule Take 20 mg by mouth daily.       . risedronate (ACTONEL) 35 MG tablet Take 35 mg by mouth every 7 (seven) days. with water on empty stomach, nothing by mouth or lie down for next 30 minutes.        No current facility-administered medications for this visit.     Past Medical History  Diagnosis Date  . GERD   . GASTRIC ULCER   . CONSTIPATION   . Breast cancer   . Osteoporosis   . HTN (hypertension)   . Hypothyroidism    . Vitamin D deficiency   . Arthritis   . Complete heart block     S/P pacemaker  . Cardiomyopathy     ROS:   All systems reviewed and negative except as noted in the HPI.   Past Surgical History  Procedure Laterality Date  . Cholecystectomy    . Doppler echocardiography  2007     Family History  Problem Relation Age of Onset  . Breast cancer       History   Social History  . Marital Status: Widowed    Spouse Name: N/A    Number of Children: N/A  . Years of Education: N/A   Occupational History  . Not on file.   Social History Main Topics  . Smoking status: Never Smoker   . Smokeless tobacco: Not on file  . Alcohol Use: No  . Drug Use: No  . Sexual Activity: Not on file   Other Topics Concern  . Not on file   Social History Narrative  . No narrative on file     BP 154/88  Pulse 64  Ht 5' (1.524 m)  Wt 103 lb (46.72 kg)  BMI 20.12  kg/m2  Physical Exam:  Well appearing elderly woman, NAD HEENT: Unremarkable Neck:  No JVD, no thyromegally Lungs:  Clear with no wheezes, rales, or rhonchi. HEART:  Regular rate rhythm, no murmurs, no rubs, no clicks Abd:  soft, positive bowel sounds, no organomegally, no rebound, no guarding Ext:  2 plus pulses, no edema, no cyanosis, no clubbing Skin:  No rashes no nodules Neuro:  CN II through XII intact, motor grossly intact  DEVICE  Normal device function.  See PaceArt for details.   Assess/Plan:

## 2014-01-08 NOTE — Patient Instructions (Signed)
Your physician wants you to follow-up in: [place] will receive a reminder letter in the mail two months in advance. If you don't receive a letter, please call our office to schedule the follow-up appointment.   Your physician wants you to follow-up in: [place] will receive a reminder letter in the mail two months in advance. If you don't receive a letter, please call our office to schedule the follow-up appointment.

## 2014-01-08 NOTE — Assessment & Plan Note (Signed)
The patient's pacemaker is working normally. We'll plan to recheck in several months.

## 2014-01-08 NOTE — Assessment & Plan Note (Signed)
She appears to have mild left ventricular dysfunction and has no significant heart failure symptoms by history or exam. She will continue her current medications, and undergo a period of watchful waiting.

## 2014-01-08 NOTE — Assessment & Plan Note (Signed)
Her blood pressure is elevated today. She is encouraged to reduce her sodium intake. If her blood pressure remains elevated, we would consider up titration of her beta blocker.

## 2014-01-25 ENCOUNTER — Other Ambulatory Visit (HOSPITAL_COMMUNITY): Payer: Self-pay | Admitting: Internal Medicine

## 2014-02-17 DIAGNOSIS — E039 Hypothyroidism, unspecified: Secondary | ICD-10-CM | POA: Diagnosis not present

## 2014-02-17 DIAGNOSIS — E538 Deficiency of other specified B group vitamins: Secondary | ICD-10-CM | POA: Diagnosis not present

## 2014-02-17 DIAGNOSIS — R413 Other amnesia: Secondary | ICD-10-CM | POA: Diagnosis not present

## 2014-02-17 DIAGNOSIS — G2581 Restless legs syndrome: Secondary | ICD-10-CM | POA: Diagnosis not present

## 2014-02-17 DIAGNOSIS — R443 Hallucinations, unspecified: Secondary | ICD-10-CM | POA: Diagnosis not present

## 2014-02-20 ENCOUNTER — Other Ambulatory Visit (HOSPITAL_COMMUNITY): Payer: Self-pay | Admitting: Internal Medicine

## 2014-03-03 DIAGNOSIS — N318 Other neuromuscular dysfunction of bladder: Secondary | ICD-10-CM | POA: Diagnosis not present

## 2014-03-03 DIAGNOSIS — R443 Hallucinations, unspecified: Secondary | ICD-10-CM | POA: Diagnosis not present

## 2014-03-03 DIAGNOSIS — F0390 Unspecified dementia without behavioral disturbance: Secondary | ICD-10-CM | POA: Diagnosis not present

## 2014-03-03 DIAGNOSIS — G4700 Insomnia, unspecified: Secondary | ICD-10-CM | POA: Diagnosis not present

## 2014-03-04 ENCOUNTER — Other Ambulatory Visit: Payer: Self-pay | Admitting: Physician Assistant

## 2014-03-04 DIAGNOSIS — R443 Hallucinations, unspecified: Secondary | ICD-10-CM

## 2014-03-04 DIAGNOSIS — R413 Other amnesia: Secondary | ICD-10-CM

## 2014-03-09 ENCOUNTER — Other Ambulatory Visit: Payer: Self-pay | Admitting: Physician Assistant

## 2014-03-09 DIAGNOSIS — R413 Other amnesia: Secondary | ICD-10-CM

## 2014-03-09 DIAGNOSIS — R443 Hallucinations, unspecified: Secondary | ICD-10-CM

## 2014-03-12 ENCOUNTER — Ambulatory Visit
Admission: RE | Admit: 2014-03-12 | Discharge: 2014-03-12 | Disposition: A | Discharge status: Home / Self Care | Payer: Medicare Other | Source: Ambulatory Visit | Attending: Physician Assistant | Admitting: Physician Assistant

## 2014-03-12 ENCOUNTER — Other Ambulatory Visit: Payer: Medicare Other

## 2014-03-12 DIAGNOSIS — R443 Hallucinations, unspecified: Secondary | ICD-10-CM

## 2014-03-12 DIAGNOSIS — G9389 Other specified disorders of brain: Secondary | ICD-10-CM | POA: Diagnosis not present

## 2014-03-12 DIAGNOSIS — R413 Other amnesia: Secondary | ICD-10-CM

## 2014-03-12 MED ORDER — IOHEXOL 300 MG/ML  SOLN
75.0000 mL | Freq: Once | INTRAMUSCULAR | Status: AC | PRN
  Administered 2014-03-12: 75 mL via INTRAVENOUS

## 2014-03-29 DIAGNOSIS — Z853 Personal history of malignant neoplasm of breast: Secondary | ICD-10-CM | POA: Diagnosis not present

## 2014-03-29 DIAGNOSIS — Z1231 Encounter for screening mammogram for malignant neoplasm of breast: Secondary | ICD-10-CM | POA: Diagnosis not present

## 2014-04-06 ENCOUNTER — Ambulatory Visit (INDEPENDENT_AMBULATORY_CARE_PROVIDER_SITE_OTHER): Payer: Medicare Other | Admitting: *Deleted

## 2014-04-06 DIAGNOSIS — I442 Atrioventricular block, complete: Secondary | ICD-10-CM

## 2014-04-06 LAB — MDC_IDC_ENUM_SESS_TYPE_INCLINIC
Battery Remaining Longevity: 92.4 mo
Battery Voltage: 2.93 V
Brady Statistic RV Percent Paced: 0.31 %
Date Time Interrogation Session: 20150407142650
Implantable Pulse Generator Model: 2210
Lead Channel Impedance Value: 325 Ohm
Lead Channel Pacing Threshold Amplitude: 0.75 V
Lead Channel Pacing Threshold Amplitude: 0.75 V
Lead Channel Sensing Intrinsic Amplitude: 2.1 mV
Lead Channel Setting Pacing Amplitude: 1.75 V
Lead Channel Setting Sensing Sensitivity: 1 mV
MDC IDC MSMT LEADCHNL RA PACING THRESHOLD PULSEWIDTH: 0.4 ms
MDC IDC MSMT LEADCHNL RV IMPEDANCE VALUE: 387.5 Ohm
MDC IDC MSMT LEADCHNL RV PACING THRESHOLD PULSEWIDTH: 0.4 ms
MDC IDC MSMT LEADCHNL RV SENSING INTR AMPL: 3.7 mV
MDC IDC PG SERIAL: 7271891
MDC IDC SET LEADCHNL RV PACING AMPLITUDE: 1 V
MDC IDC SET LEADCHNL RV PACING PULSEWIDTH: 0.4 ms
MDC IDC STAT BRADY RA PERCENT PACED: 30 %

## 2014-04-07 DIAGNOSIS — R197 Diarrhea, unspecified: Secondary | ICD-10-CM | POA: Diagnosis not present

## 2014-04-07 DIAGNOSIS — Z950 Presence of cardiac pacemaker: Secondary | ICD-10-CM | POA: Diagnosis not present

## 2014-04-07 DIAGNOSIS — R0989 Other specified symptoms and signs involving the circulatory and respiratory systems: Secondary | ICD-10-CM | POA: Diagnosis not present

## 2014-04-12 NOTE — Progress Notes (Signed)
Pacemaker check in clinic due to a vibration patient felt on the R side of her abdomen moving up to her chest. Patient says that the vibration started ast night and would come on every 1-2 mins. Vibrations occurred in the ofc with pacing and intrinsic rhythm, during tests and without tests. Relieved after 3 BMs. Normal device function. Thresholds, sensing, impedances consistent with previous measurements. Device programmed to maximize longevity. 11 mode switches (<1%)---max dur. 16 sec, Max A 199, Max V 94---last 4-3----AT. No high ventricular rates noted. Device programmed at appropriate safety margins. Histogram distribution appropriate for patient activity level. Device programmed to optimize intrinsic conduction. Estimated longevity 7.0-7.7 years. Patient was seen by SK and advised to F/U with PCP for suspected GI problems. Patient will F/U with the device clinic in July as scheduled.

## 2014-04-14 DIAGNOSIS — M810 Age-related osteoporosis without current pathological fracture: Secondary | ICD-10-CM | POA: Diagnosis not present

## 2014-04-14 DIAGNOSIS — F0390 Unspecified dementia without behavioral disturbance: Secondary | ICD-10-CM | POA: Diagnosis not present

## 2014-04-14 DIAGNOSIS — G2581 Restless legs syndrome: Secondary | ICD-10-CM | POA: Diagnosis not present

## 2014-04-30 ENCOUNTER — Encounter: Payer: Self-pay | Admitting: Internal Medicine

## 2014-06-14 DIAGNOSIS — G2581 Restless legs syndrome: Secondary | ICD-10-CM | POA: Diagnosis not present

## 2014-06-14 DIAGNOSIS — F0390 Unspecified dementia without behavioral disturbance: Secondary | ICD-10-CM | POA: Diagnosis not present

## 2014-06-14 DIAGNOSIS — E039 Hypothyroidism, unspecified: Secondary | ICD-10-CM | POA: Diagnosis not present

## 2014-06-14 DIAGNOSIS — I10 Essential (primary) hypertension: Secondary | ICD-10-CM | POA: Diagnosis not present

## 2014-06-14 DIAGNOSIS — Z9181 History of falling: Secondary | ICD-10-CM | POA: Diagnosis not present

## 2014-07-07 ENCOUNTER — Encounter: Payer: Self-pay | Admitting: Internal Medicine

## 2014-07-07 ENCOUNTER — Ambulatory Visit (INDEPENDENT_AMBULATORY_CARE_PROVIDER_SITE_OTHER): Payer: Medicare Other | Admitting: *Deleted

## 2014-07-07 DIAGNOSIS — Z950 Presence of cardiac pacemaker: Secondary | ICD-10-CM

## 2014-07-07 DIAGNOSIS — I442 Atrioventricular block, complete: Secondary | ICD-10-CM

## 2014-07-07 LAB — MDC_IDC_ENUM_SESS_TYPE_INCLINIC
Battery Remaining Longevity: 120 mo
Implantable Pulse Generator Model: 2210
Implantable Pulse Generator Serial Number: 7271891
Lead Channel Pacing Threshold Amplitude: 0.625 V
Lead Channel Pacing Threshold Pulse Width: 0.4 ms
Lead Channel Sensing Intrinsic Amplitude: 2.4 mV
Lead Channel Sensing Intrinsic Amplitude: 3.4 mV
Lead Channel Setting Pacing Amplitude: 0.875
Lead Channel Setting Pacing Amplitude: 1.75 V
Lead Channel Setting Pacing Pulse Width: 0.4 ms
Lead Channel Setting Sensing Sensitivity: 1 mV
MDC IDC MSMT BATTERY VOLTAGE: 2.95 V
MDC IDC MSMT LEADCHNL RA IMPEDANCE VALUE: 350 Ohm
MDC IDC MSMT LEADCHNL RA PACING THRESHOLD AMPLITUDE: 0.75 V
MDC IDC MSMT LEADCHNL RA PACING THRESHOLD PULSEWIDTH: 0.4 ms
MDC IDC MSMT LEADCHNL RV IMPEDANCE VALUE: 475 Ohm
MDC IDC SESS DTM: 20150708105033
MDC IDC STAT BRADY RA PERCENT PACED: 56 %
MDC IDC STAT BRADY RV PERCENT PACED: 4.4 %

## 2014-07-07 NOTE — Progress Notes (Signed)
Pacemaker check in clinic. Normal device function. Thresholds, sensing, impedances consistent with previous measurements. Device programmed to maximize longevity. 11 mode switches--- <1%, longest 28sec. No high ventricular rates noted. Device programmed at appropriate safety margins. Histogram distribution appropriate for patient activity level. Device programmed to optimize intrinsic conduction. Estimated longevity 9.6-10.38yrs. ROV w/ Dr. Lovena Le in 67mo.

## 2014-08-17 DIAGNOSIS — H2640 Unspecified secondary cataract: Secondary | ICD-10-CM | POA: Diagnosis not present

## 2014-08-17 DIAGNOSIS — H3530 Unspecified macular degeneration: Secondary | ICD-10-CM | POA: Diagnosis not present

## 2014-08-17 DIAGNOSIS — H43399 Other vitreous opacities, unspecified eye: Secondary | ICD-10-CM | POA: Diagnosis not present

## 2014-09-05 ENCOUNTER — Other Ambulatory Visit (HOSPITAL_COMMUNITY): Payer: Self-pay | Admitting: Internal Medicine

## 2014-09-09 DIAGNOSIS — F0390 Unspecified dementia without behavioral disturbance: Secondary | ICD-10-CM | POA: Diagnosis not present

## 2014-09-09 DIAGNOSIS — R197 Diarrhea, unspecified: Secondary | ICD-10-CM | POA: Diagnosis not present

## 2014-09-09 DIAGNOSIS — H5316 Psychophysical visual disturbances: Secondary | ICD-10-CM | POA: Diagnosis not present

## 2014-10-02 ENCOUNTER — Other Ambulatory Visit (HOSPITAL_COMMUNITY): Payer: Self-pay | Admitting: Internal Medicine

## 2014-10-04 ENCOUNTER — Encounter: Payer: Self-pay | Admitting: Internal Medicine

## 2014-10-07 ENCOUNTER — Other Ambulatory Visit (HOSPITAL_COMMUNITY): Payer: Self-pay | Admitting: Internal Medicine

## 2014-11-15 DIAGNOSIS — Z23 Encounter for immunization: Secondary | ICD-10-CM | POA: Diagnosis not present

## 2014-11-15 DIAGNOSIS — E039 Hypothyroidism, unspecified: Secondary | ICD-10-CM | POA: Diagnosis not present

## 2014-11-15 DIAGNOSIS — R441 Visual hallucinations: Secondary | ICD-10-CM | POA: Diagnosis not present

## 2014-11-15 DIAGNOSIS — E538 Deficiency of other specified B group vitamins: Secondary | ICD-10-CM | POA: Diagnosis not present

## 2014-11-15 DIAGNOSIS — K219 Gastro-esophageal reflux disease without esophagitis: Secondary | ICD-10-CM | POA: Diagnosis not present

## 2014-11-15 DIAGNOSIS — I10 Essential (primary) hypertension: Secondary | ICD-10-CM | POA: Diagnosis not present

## 2014-11-15 DIAGNOSIS — F0390 Unspecified dementia without behavioral disturbance: Secondary | ICD-10-CM | POA: Diagnosis not present

## 2014-12-02 ENCOUNTER — Encounter: Payer: Self-pay | Admitting: Neurology

## 2014-12-02 ENCOUNTER — Ambulatory Visit (INDEPENDENT_AMBULATORY_CARE_PROVIDER_SITE_OTHER): Payer: Medicare Other | Admitting: Neurology

## 2014-12-02 VITALS — BP 159/96 | HR 70 | Temp 97.8°F | Ht 60.0 in | Wt 111.0 lb

## 2014-12-02 DIAGNOSIS — F0391 Unspecified dementia with behavioral disturbance: Secondary | ICD-10-CM

## 2014-12-02 DIAGNOSIS — R443 Hallucinations, unspecified: Secondary | ICD-10-CM | POA: Diagnosis not present

## 2014-12-02 MED ORDER — MEMANTINE HCL ER 7 MG PO CP24: 7.0000 mg | ORAL_CAPSULE | Freq: Every day | ORAL | Status: DC
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
## 2014-12-02 NOTE — Patient Instructions (Addendum)
I want to suggest a few things today:  Remember to drink plenty of fluid, eat healthy meals and do not skip any meals. Try to eat protein with a every meal and eat a healthy snack such as fruit or nuts in between meals. Try to keep a regular sleep-wake schedule and try to exercise daily, particularly in the form of walking, 20-30 minutes a day, if you can. Good nutrition, proper sleep and exercise can help her cognitive function.  Engage in social activities in your community and with your family and try to keep up with current events by reading the newspaper or watching the news. If you have computer and can go online, try [email or web address]. Also, you may like to do word finding puzzles or crossword puzzles.  As far as your medications are concerned, I would like to suggest Namenda XR, starting at 7 mg once daily with gradual build up. Side effects include: nausea, confusion, hallucination, personality changes. If you are having mild side effects, try to stick with the treatment as these initial side effects may go away after the first 10-14 days.      You can try Melatonin at night for sleep: take 3 to 5 mg one to 2 hours before your bedtime.    As far as diagnostic testing: no new test at this time. You had blood work and you had a CT head.   I would like to see you back in 3 months, sooner if we need to. Please call us with any interim questions, concerns, problems, updates or refill requests.  Our phone number is [phone]. We also have an after hours call service for urgent matters and there is a physician on-call for urgent questions. For any emergencies you know to call 911 or go to the nearest emergency room.

## 2014-12-20 DIAGNOSIS — R441 Visual hallucinations: Secondary | ICD-10-CM | POA: Diagnosis not present

## 2014-12-20 DIAGNOSIS — Z9181 History of falling: Secondary | ICD-10-CM | POA: Diagnosis not present

## 2014-12-20 DIAGNOSIS — Z1389 Encounter for screening for other disorder: Secondary | ICD-10-CM | POA: Diagnosis not present

## 2014-12-20 DIAGNOSIS — F0390 Unspecified dementia without behavioral disturbance: Secondary | ICD-10-CM | POA: Diagnosis not present

## 2015-01-19 DIAGNOSIS — M5136 Other intervertebral disc degeneration, lumbar region: Secondary | ICD-10-CM | POA: Diagnosis not present

## 2015-01-19 DIAGNOSIS — F0390 Unspecified dementia without behavioral disturbance: Secondary | ICD-10-CM | POA: Diagnosis not present

## 2015-01-19 DIAGNOSIS — Z96641 Presence of right artificial hip joint: Secondary | ICD-10-CM | POA: Diagnosis not present

## 2015-01-19 DIAGNOSIS — M79606 Pain in leg, unspecified: Secondary | ICD-10-CM | POA: Diagnosis not present

## 2015-01-19 DIAGNOSIS — I10 Essential (primary) hypertension: Secondary | ICD-10-CM | POA: Diagnosis not present

## 2015-01-19 DIAGNOSIS — M4186 Other forms of scoliosis, lumbar region: Secondary | ICD-10-CM | POA: Diagnosis not present

## 2015-01-19 DIAGNOSIS — M47816 Spondylosis without myelopathy or radiculopathy, lumbar region: Secondary | ICD-10-CM | POA: Diagnosis not present

## 2015-01-19 DIAGNOSIS — M419 Scoliosis, unspecified: Secondary | ICD-10-CM | POA: Diagnosis not present

## 2015-01-19 DIAGNOSIS — M5432 Sciatica, left side: Secondary | ICD-10-CM | POA: Diagnosis not present

## 2015-01-19 DIAGNOSIS — Z96642 Presence of left artificial hip joint: Secondary | ICD-10-CM | POA: Diagnosis not present

## 2015-01-19 DIAGNOSIS — M4316 Spondylolisthesis, lumbar region: Secondary | ICD-10-CM | POA: Diagnosis not present

## 2015-01-23 ENCOUNTER — Other Ambulatory Visit (HOSPITAL_COMMUNITY): Payer: Self-pay | Admitting: Internal Medicine

## 2015-01-27 DIAGNOSIS — Z9181 History of falling: Secondary | ICD-10-CM | POA: Diagnosis not present

## 2015-01-27 DIAGNOSIS — M5432 Sciatica, left side: Secondary | ICD-10-CM | POA: Diagnosis not present

## 2015-01-29 ENCOUNTER — Other Ambulatory Visit (HOSPITAL_COMMUNITY): Payer: Self-pay | Admitting: Internal Medicine

## 2015-02-02 ENCOUNTER — Other Ambulatory Visit (HOSPITAL_COMMUNITY): Payer: Self-pay | Admitting: Internal Medicine

## 2015-02-03 ENCOUNTER — Encounter: Payer: Self-pay | Admitting: *Deleted

## 2015-02-24 ENCOUNTER — Encounter: Payer: Self-pay | Admitting: *Deleted

## 2015-03-03 ENCOUNTER — Encounter: Payer: Self-pay | Admitting: Neurology

## 2015-03-03 ENCOUNTER — Encounter: Payer: Self-pay | Admitting: Internal Medicine

## 2015-03-03 ENCOUNTER — Ambulatory Visit (INDEPENDENT_AMBULATORY_CARE_PROVIDER_SITE_OTHER): Payer: Medicare Other | Admitting: Internal Medicine

## 2015-03-03 ENCOUNTER — Ambulatory Visit (INDEPENDENT_AMBULATORY_CARE_PROVIDER_SITE_OTHER): Payer: Medicare Other | Admitting: Neurology

## 2015-03-03 ENCOUNTER — Encounter: Payer: Medicare Other | Admitting: Internal Medicine

## 2015-03-03 VITALS — BP 169/94 | HR 67 | Temp 95.8°F | Resp 16 | Ht 60.0 in | Wt 110.0 lb

## 2015-03-03 VITALS — BP 148/80 | HR 67 | Ht 60.0 in | Wt 111.1 lb

## 2015-03-03 DIAGNOSIS — F0391 Unspecified dementia with behavioral disturbance: Secondary | ICD-10-CM

## 2015-03-03 DIAGNOSIS — I10 Essential (primary) hypertension: Secondary | ICD-10-CM | POA: Diagnosis not present

## 2015-03-03 DIAGNOSIS — R443 Hallucinations, unspecified: Secondary | ICD-10-CM

## 2015-03-03 DIAGNOSIS — I429 Cardiomyopathy, unspecified: Secondary | ICD-10-CM | POA: Diagnosis not present

## 2015-03-03 DIAGNOSIS — Z950 Presence of cardiac pacemaker: Secondary | ICD-10-CM

## 2015-03-03 DIAGNOSIS — I428 Other cardiomyopathies: Secondary | ICD-10-CM

## 2015-03-03 LAB — MDC_IDC_ENUM_SESS_TYPE_INCLINIC
Brady Statistic RA Percent Paced: 41 %
Brady Statistic RV Percent Paced: 1.6 %
Date Time Interrogation Session: 20160303172239
Implantable Pulse Generator Model: 2210
Implantable Pulse Generator Serial Number: 7271891
Lead Channel Impedance Value: 537.5 Ohm
Lead Channel Pacing Threshold Amplitude: 0.625 V
Lead Channel Pacing Threshold Amplitude: 0.75 V
Lead Channel Pacing Threshold Pulse Width: 0.4 ms
Lead Channel Pacing Threshold Pulse Width: 0.4 ms
Lead Channel Sensing Intrinsic Amplitude: 4.1 mV
Lead Channel Setting Pacing Amplitude: 0.875
Lead Channel Setting Pacing Amplitude: 1.75 V
Lead Channel Setting Sensing Sensitivity: 1 mV
MDC IDC MSMT BATTERY REMAINING LONGEVITY: 122.4 mo
MDC IDC MSMT BATTERY VOLTAGE: 2.95 V
MDC IDC MSMT LEADCHNL RA IMPEDANCE VALUE: 337.5 Ohm
MDC IDC MSMT LEADCHNL RA SENSING INTR AMPL: 1.8 mV
MDC IDC SET LEADCHNL RV PACING PULSEWIDTH: 0.4 ms

## 2015-03-03 MED ORDER — MEMANTINE HCL ER 14 MG PO CP24: 14.0000 mg | ORAL_CAPSULE | Freq: Every day | ORAL | Status: DC
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
## 2015-03-03 NOTE — Assessment & Plan Note (Signed)
Her blood pressure is slightly elevated. She will continue her current medications, and is instructed to maintain a low-sodium diet.

## 2015-03-03 NOTE — Patient Instructions (Signed)
1. We will increase your Namenda XR to 14 mg once daily in the morning. You can use up the 7 mg capsules you have and take 2 of those each day, until those are finished.  2. You can use Melatonin at night for sleep: take 6 mg one to 2 hours before your bedtime.  3. Try to walk regularly and drink more water.

## 2015-03-03 NOTE — Patient Instructions (Signed)
Your physician wants you to follow-up in: 6 months with device clinic and 12 months with Dr Taylor You will receive a reminder letter in the mail two months in advance. If you don't receive a letter, please call our office to schedule the follow-up appointment.  

## 2015-03-03 NOTE — Assessment & Plan Note (Signed)
She has no heart failure symptoms. She is fairly sedentary. She is encouraged to maintain a low-sodium diet. She will continue her beta blocker and ACE inhibitor.

## 2015-03-03 NOTE — Progress Notes (Signed)
HPI [name] returns today for followup. She is a very pleasant 81 year old woman with symptomatic bradycardia, status post permanent pacemaker insertion, hypertension, and a sedentary lifestyle. She is been stable in the interim, but she denies any exercise. She states that she has had problems hearing things and seeing things that she knows is not there. Last week, she states that she saw her husband who had been dead for 10 years. She understands that these hallucinations are illogical. Allergies  Allergen Reactions  . Pramipexole Dihydrochloride     REACTION: generic mirapex give hallucinations  . Sulfonamide Derivatives     REACTION: Rash     Current Outpatient Prescriptions  Medication Sig Dispense Refill  . aspirin 81 MG tablet Take 81 mg by mouth daily.      . Calcium-Vitamin D (CALTRATE 600 PLUS-VIT D PO) Take 2 tablets by mouth daily.     . carvedilol (COREG) 6.25 MG tablet TAKE 1 TABLET BY MOUTH TWICE A DAY WITH A MEAL 60 tablet 11  . cholecalciferol (VITAMIN D) 1000 UNITS tablet Take 1,000 Units by mouth daily.     . cyanocobalamin 2000 MCG tablet Take 2,000 mcg by mouth daily.      Marland Kitchen levothyroxine (SYNTHROID, LEVOTHROID) 75 MCG tablet Take 75 mcg by mouth daily before breakfast.    . lisinopril (PRINIVIL,ZESTRIL) 10 MG tablet TAKE 1 TABLET BY MOUTH EVERY DAY 30 tablet 3  . Melatonin 3 MG CAPS Take 2 capsules by mouth at bedtime.    . memantine (NAMENDA XR) 14 MG CP24 24 hr capsule Take 1 capsule (14 mg total) by mouth daily. 30 capsule 5  . pantoprazole (PROTONIX) 40 MG tablet Take 40 mg by mouth daily.     No current facility-administered medications for this visit.     Past Medical History  Diagnosis Date  . GERD   . GASTRIC ULCER   . CONSTIPATION   . Breast cancer   . Osteoporosis   . HTN (hypertension)   . Hypothyroidism   . Vitamin D deficiency   . Arthritis   . Complete heart block     S/P pacemaker  . Cardiomyopathy     ROS:   All systems  reviewed and negative except as noted in the HPI.   Past Surgical History  Procedure Laterality Date  . Cholecystectomy    . Doppler echocardiography  2007     Family History  Problem Relation Age of Onset  . Breast cancer Mother      History   Social History  . Marital Status: Widowed    Spouse Name: N/A  . Number of Children: 3  . Years of Education: 12   Occupational History  .      retired   Social History Main Topics  . Smoking status: Never Smoker   . Smokeless tobacco: Never Used  . Alcohol Use: No  . Drug Use: No  . Sexual Activity: Not on file   Other Topics Concern  . Not on file   Social History Narrative   Patient consumes  Caffeine daily     BP 148/80 mmHg  Pulse 67  Ht 5' (1.524 m)  Wt 111 lb 1.9 oz (50.404 kg)  BMI 21.70 kg/m2  Physical Exam:  Well appearing elderly woman, NAD HEENT: Unremarkable Neck:  No JVD, no thyromegally Lungs:  Clear with no wheezes, rales, or rhonchi. HEART:  Regular rate rhythm, no murmurs, no rubs, no clicks Abd:  soft, positive bowel sounds, no organomegally,  no rebound, no guarding Ext:  2 plus pulses, no edema, no cyanosis, no clubbing Skin:  No rashes no nodules Neuro:  CN II through XII intact, motor grossly intact  DEVICE  Normal device function.  See PaceArt for details.   Assess/Plan:

## 2015-03-03 NOTE — Assessment & Plan Note (Signed)
Her [employer] dual-chamber pacemaker is working normally. Plan to recheck in several months.

## 2015-03-10 ENCOUNTER — Other Ambulatory Visit (HOSPITAL_COMMUNITY): Payer: Self-pay | Admitting: Internal Medicine

## 2015-03-14 ENCOUNTER — Encounter: Payer: Self-pay | Admitting: Internal Medicine

## 2015-05-26 DIAGNOSIS — Z9181 History of falling: Secondary | ICD-10-CM | POA: Diagnosis not present

## 2015-05-26 DIAGNOSIS — Z1389 Encounter for screening for other disorder: Secondary | ICD-10-CM | POA: Diagnosis not present

## 2015-05-26 DIAGNOSIS — Z6821 Body mass index (BMI) 21.0-21.9, adult: Secondary | ICD-10-CM | POA: Diagnosis not present

## 2015-05-26 DIAGNOSIS — B373 Candidiasis of vulva and vagina: Secondary | ICD-10-CM | POA: Diagnosis not present

## 2015-06-06 ENCOUNTER — Ambulatory Visit: Payer: Medicare Other | Admitting: Neurology

## 2015-06-06 ENCOUNTER — Telehealth: Payer: Self-pay

## 2015-06-06 NOTE — Telephone Encounter (Signed)
Patient did not come to appt today.

## 2015-06-08 ENCOUNTER — Encounter: Payer: Self-pay | Admitting: Neurology

## 2015-06-08 DIAGNOSIS — Z1389 Encounter for screening for other disorder: Secondary | ICD-10-CM | POA: Diagnosis not present

## 2015-06-08 DIAGNOSIS — I10 Essential (primary) hypertension: Secondary | ICD-10-CM | POA: Diagnosis not present

## 2015-06-08 DIAGNOSIS — B373 Candidiasis of vulva and vagina: Secondary | ICD-10-CM | POA: Diagnosis not present

## 2015-06-08 DIAGNOSIS — F0390 Unspecified dementia without behavioral disturbance: Secondary | ICD-10-CM | POA: Diagnosis not present

## 2015-06-08 DIAGNOSIS — R441 Visual hallucinations: Secondary | ICD-10-CM | POA: Diagnosis not present

## 2015-06-16 ENCOUNTER — Telehealth: Payer: Self-pay | Admitting: Neurology

## 2015-06-23 ENCOUNTER — Encounter: Payer: Self-pay | Admitting: Neurology

## 2015-06-23 ENCOUNTER — Ambulatory Visit (INDEPENDENT_AMBULATORY_CARE_PROVIDER_SITE_OTHER): Payer: Medicare Other | Admitting: Neurology

## 2015-06-23 VITALS — BP 148/98 | HR 72 | Resp 14 | Ht 60.0 in | Wt 102.0 lb

## 2015-06-23 DIAGNOSIS — F0391 Unspecified dementia with behavioral disturbance: Secondary | ICD-10-CM | POA: Diagnosis not present

## 2015-06-23 DIAGNOSIS — R443 Hallucinations, unspecified: Secondary | ICD-10-CM

## 2015-06-23 MED ORDER — MEMANTINE HCL ER 21 MG PO CP24: 21.0000 mg | ORAL_CAPSULE | Freq: Every day | ORAL | Status: DC
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
## 2015-06-23 NOTE — Patient Instructions (Signed)
Use your cane or walker at all times.  Drink more water.  We will increase your Namenda XR to 21 mg once daily.

## 2015-06-30 NOTE — Telephone Encounter (Signed)
error 

## 2015-07-07 ENCOUNTER — Other Ambulatory Visit (HOSPITAL_COMMUNITY): Payer: Self-pay | Admitting: Internal Medicine

## 2015-07-07 ENCOUNTER — Other Ambulatory Visit: Payer: Self-pay

## 2015-07-07 MED ORDER — LISINOPRIL 10 MG PO TABS: 10.0000 mg | ORAL_TABLET | Freq: Every day | ORAL | Status: DC

## 2015-08-08 DIAGNOSIS — Z79899 Other long term (current) drug therapy: Secondary | ICD-10-CM | POA: Diagnosis not present

## 2015-08-08 DIAGNOSIS — K219 Gastro-esophageal reflux disease without esophagitis: Secondary | ICD-10-CM | POA: Diagnosis not present

## 2015-08-08 DIAGNOSIS — I10 Essential (primary) hypertension: Secondary | ICD-10-CM | POA: Diagnosis not present

## 2015-08-08 DIAGNOSIS — R441 Visual hallucinations: Secondary | ICD-10-CM | POA: Diagnosis not present

## 2015-08-08 DIAGNOSIS — F0390 Unspecified dementia without behavioral disturbance: Secondary | ICD-10-CM | POA: Diagnosis not present

## 2015-08-08 DIAGNOSIS — E039 Hypothyroidism, unspecified: Secondary | ICD-10-CM | POA: Diagnosis not present

## 2015-08-08 DIAGNOSIS — Z1389 Encounter for screening for other disorder: Secondary | ICD-10-CM | POA: Diagnosis not present

## 2015-08-08 DIAGNOSIS — Z950 Presence of cardiac pacemaker: Secondary | ICD-10-CM | POA: Diagnosis not present

## 2015-08-31 ENCOUNTER — Ambulatory Visit (INDEPENDENT_AMBULATORY_CARE_PROVIDER_SITE_OTHER): Payer: Medicare Other | Admitting: *Deleted

## 2015-08-31 ENCOUNTER — Encounter: Payer: Self-pay | Admitting: Internal Medicine

## 2015-08-31 DIAGNOSIS — Z950 Presence of cardiac pacemaker: Secondary | ICD-10-CM

## 2015-08-31 LAB — CUP PACEART INCLINIC DEVICE CHECK
Battery Remaining Longevity: 111.6 mo
Battery Voltage: 2.93 V
Brady Statistic RA Percent Paced: 35 %
Date Time Interrogation Session: 20160831101032
Lead Channel Impedance Value: 337.5 Ohm
Lead Channel Sensing Intrinsic Amplitude: 1.5 mV
Lead Channel Sensing Intrinsic Amplitude: 3.8 mV
Lead Channel Setting Pacing Amplitude: 1.125
Lead Channel Setting Pacing Amplitude: 1.75 V
Lead Channel Setting Sensing Sensitivity: 1 mV
MDC IDC MSMT LEADCHNL RA PACING THRESHOLD AMPLITUDE: 0.75 V
MDC IDC MSMT LEADCHNL RA PACING THRESHOLD PULSEWIDTH: 0.4 ms
MDC IDC MSMT LEADCHNL RV IMPEDANCE VALUE: 487.5 Ohm
MDC IDC MSMT LEADCHNL RV PACING THRESHOLD AMPLITUDE: 0.75 V
MDC IDC MSMT LEADCHNL RV PACING THRESHOLD PULSEWIDTH: 0.4 ms
MDC IDC SET LEADCHNL RV PACING PULSEWIDTH: 0.4 ms
MDC IDC STAT BRADY RV PERCENT PACED: 0.26 %
Pulse Gen Serial Number: 7271891

## 2015-08-31 NOTE — Progress Notes (Signed)
Pacemaker check in clinic. Normal device function. Thresholds, sensing, impedances consistent with previous measurements. Device programmed to maximize longevity. 41 mode switches- longest 32 seconds, pk A 248, pk V 98. No high ventricular rates noted. Device programmed at appropriate safety margins. Histogram distribution appropriate for patient activity level. Device programmed to optimize intrinsic conduction. Estimated longevity 8.2-9.2 years. ROV with GT in March.

## 2015-11-03 ENCOUNTER — Ambulatory Visit (INDEPENDENT_AMBULATORY_CARE_PROVIDER_SITE_OTHER): Payer: Medicare Other | Admitting: Neurology

## 2015-11-03 ENCOUNTER — Encounter: Payer: Self-pay | Admitting: Neurology

## 2015-11-03 VITALS — BP 160/90 | HR 70 | Resp 14 | Ht 60.0 in | Wt 104.0 lb

## 2015-11-03 DIAGNOSIS — R443 Hallucinations, unspecified: Secondary | ICD-10-CM

## 2015-11-03 DIAGNOSIS — F0391 Unspecified dementia with behavioral disturbance: Secondary | ICD-10-CM

## 2015-11-03 NOTE — Patient Instructions (Signed)
We will continue with your medications.  I think overall you are doing fairly well but I do want to suggest a few things today:  Remember to drink plenty of fluid, eat healthy meals and do not skip any meals. Try to eat protein with a every meal and eat a healthy snack such as fruit or nuts in between meals. Try to keep a regular sleep-wake schedule and try to exercise daily, particularly in the form of walking, 10-20 minutes a day, if you can. Good nutrition, proper sleep and exercise can help her cognitive function.  Engage in social activities in your community and with your family and try to keep up with current events by reading the newspaper or watching the news. If you have computer and can go online, try [email or web address]. Also, you may like to do word finding puzzles or crossword puzzles.  As far as your medications are concerned, I would like to suggest no changes.    I would like to see you back in 6 months, sooner if we need to. Please call us with any interim questions, concerns, problems, updates or refill requests.  Our phone number is [phone]. We also have an after hours call service for urgent matters and there is a physician on-call for urgent questions. For any emergencies you know to call 911 or go to the nearest emergency room.
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
## 2015-11-07 ENCOUNTER — Other Ambulatory Visit (HOSPITAL_COMMUNITY): Payer: Self-pay | Admitting: Internal Medicine

## 2015-11-13 ENCOUNTER — Inpatient Hospital Stay (HOSPITAL_COMMUNITY)
Admission: AD | Admit: 2015-11-13 | Discharge: 2015-11-15 | DRG: 280 | Disposition: A | Discharge status: Home / Self Care | Payer: Medicare Other | Source: Other Acute Inpatient Hospital | Attending: Cardiovascular Disease | Admitting: Cardiovascular Disease

## 2015-11-13 ENCOUNTER — Encounter (HOSPITAL_COMMUNITY): Payer: Self-pay

## 2015-11-13 DIAGNOSIS — I509 Heart failure, unspecified: Secondary | ICD-10-CM | POA: Diagnosis not present

## 2015-11-13 DIAGNOSIS — E876 Hypokalemia: Secondary | ICD-10-CM | POA: Diagnosis present

## 2015-11-13 DIAGNOSIS — I110 Hypertensive heart disease with heart failure: Secondary | ICD-10-CM | POA: Diagnosis present

## 2015-11-13 DIAGNOSIS — Z79899 Other long term (current) drug therapy: Secondary | ICD-10-CM

## 2015-11-13 DIAGNOSIS — R079 Chest pain, unspecified: Secondary | ICD-10-CM | POA: Diagnosis present

## 2015-11-13 DIAGNOSIS — I214 Non-ST elevation (NSTEMI) myocardial infarction: Secondary | ICD-10-CM | POA: Diagnosis not present

## 2015-11-13 DIAGNOSIS — E039 Hypothyroidism, unspecified: Secondary | ICD-10-CM | POA: Diagnosis present

## 2015-11-13 DIAGNOSIS — Z950 Presence of cardiac pacemaker: Secondary | ICD-10-CM

## 2015-11-13 DIAGNOSIS — E079 Disorder of thyroid, unspecified: Secondary | ICD-10-CM | POA: Diagnosis not present

## 2015-11-13 DIAGNOSIS — I5043 Acute on chronic combined systolic (congestive) and diastolic (congestive) heart failure: Secondary | ICD-10-CM | POA: Diagnosis present

## 2015-11-13 DIAGNOSIS — R0602 Shortness of breath: Secondary | ICD-10-CM | POA: Diagnosis not present

## 2015-11-13 DIAGNOSIS — Z7982 Long term (current) use of aspirin: Secondary | ICD-10-CM | POA: Diagnosis not present

## 2015-11-13 DIAGNOSIS — I517 Cardiomegaly: Secondary | ICD-10-CM | POA: Diagnosis not present

## 2015-11-13 DIAGNOSIS — K219 Gastro-esophageal reflux disease without esophagitis: Secondary | ICD-10-CM | POA: Diagnosis present

## 2015-11-13 DIAGNOSIS — M810 Age-related osteoporosis without current pathological fracture: Secondary | ICD-10-CM | POA: Diagnosis present

## 2015-11-13 DIAGNOSIS — R7989 Other specified abnormal findings of blood chemistry: Secondary | ICD-10-CM | POA: Diagnosis not present

## 2015-11-13 DIAGNOSIS — I10 Essential (primary) hypertension: Secondary | ICD-10-CM | POA: Diagnosis not present

## 2015-11-13 DIAGNOSIS — R0789 Other chest pain: Secondary | ICD-10-CM | POA: Diagnosis not present

## 2015-11-13 DIAGNOSIS — I4510 Unspecified right bundle-branch block: Secondary | ICD-10-CM | POA: Diagnosis not present

## 2015-11-13 DIAGNOSIS — I249 Acute ischemic heart disease, unspecified: Secondary | ICD-10-CM | POA: Diagnosis not present

## 2015-11-13 DIAGNOSIS — Z96641 Presence of right artificial hip joint: Secondary | ICD-10-CM | POA: Diagnosis not present

## 2015-11-13 DIAGNOSIS — Z853 Personal history of malignant neoplasm of breast: Secondary | ICD-10-CM

## 2015-11-13 DIAGNOSIS — I429 Cardiomyopathy, unspecified: Secondary | ICD-10-CM | POA: Diagnosis not present

## 2015-11-13 DIAGNOSIS — F0390 Unspecified dementia without behavioral disturbance: Secondary | ICD-10-CM | POA: Diagnosis present

## 2015-11-13 DIAGNOSIS — I428 Other cardiomyopathies: Secondary | ICD-10-CM

## 2015-11-13 DIAGNOSIS — R931 Abnormal findings on diagnostic imaging of heart and coronary circulation: Secondary | ICD-10-CM | POA: Diagnosis not present

## 2015-11-13 DIAGNOSIS — I2510 Atherosclerotic heart disease of native coronary artery without angina pectoris: Secondary | ICD-10-CM | POA: Diagnosis not present

## 2015-11-13 LAB — CBC WITH DIFFERENTIAL/PLATELET
BASOS ABS: 0 10*3/uL (ref 0.0–0.1)
BASOS PCT: 0 %
EOS PCT: 1 %
Eosinophils Absolute: 0.1 10*3/uL (ref 0.0–0.7)
HCT: 39.2 % (ref 36.0–46.0)
Hemoglobin: 13 g/dL (ref 12.0–15.0)
Lymphocytes Relative: 26 %
Lymphs Abs: 1.5 10*3/uL (ref 0.7–4.0)
MCH: 31.5 pg (ref 26.0–34.0)
MCHC: 33.2 g/dL (ref 30.0–36.0)
MCV: 94.9 fL (ref 78.0–100.0)
MONO ABS: 0.4 10*3/uL (ref 0.1–1.0)
MONOS PCT: 7 %
Neutro Abs: 3.9 10*3/uL (ref 1.7–7.7)
Neutrophils Relative %: 66 %
PLATELETS: 165 10*3/uL (ref 150–400)
RBC: 4.13 MIL/uL (ref 3.87–5.11)
RDW: 14.4 % (ref 11.5–15.5)
WBC: 5.9 10*3/uL (ref 4.0–10.5)

## 2015-11-13 LAB — COMPREHENSIVE METABOLIC PANEL
ALK PHOS: 109 U/L (ref 38–126)
ALT: 13 U/L — AB (ref 14–54)
AST: 25 U/L (ref 15–41)
Albumin: 3.4 g/dL — ABNORMAL LOW (ref 3.5–5.0)
Anion gap: 9 (ref 5–15)
BILIRUBIN TOTAL: 1.8 mg/dL — AB (ref 0.3–1.2)
BUN: 14 mg/dL (ref 6–20)
CALCIUM: 8.4 mg/dL — AB (ref 8.9–10.3)
CHLORIDE: 101 mmol/L (ref 101–111)
CO2: 33 mmol/L — ABNORMAL HIGH (ref 22–32)
CREATININE: 0.76 mg/dL (ref 0.44–1.00)
Glucose, Bld: 114 mg/dL — ABNORMAL HIGH (ref 65–99)
Potassium: 2.6 mmol/L — CL (ref 3.5–5.1)
Sodium: 143 mmol/L (ref 135–145)
TOTAL PROTEIN: 6 g/dL — AB (ref 6.5–8.1)

## 2015-11-13 LAB — BRAIN NATRIURETIC PEPTIDE: B Natriuretic Peptide: 1217.1 pg/mL — ABNORMAL HIGH (ref 0.0–100.0)

## 2015-11-13 LAB — PROTIME-INR
INR: 1.21 (ref 0.00–1.49)
Prothrombin Time: 15.5 seconds — ABNORMAL HIGH (ref 11.6–15.2)

## 2015-11-13 LAB — APTT: APTT: 30 s (ref 24–37)

## 2015-11-13 LAB — TROPONIN I: Troponin I: 0.06 ng/mL — ABNORMAL HIGH (ref <0.031)

## 2015-11-13 LAB — MAGNESIUM: MAGNESIUM: 1.6 mg/dL — AB (ref 1.7–2.4)

## 2015-11-13 LAB — TSH: TSH: 6.77 u[IU]/mL — AB (ref 0.350–4.500)

## 2015-11-13 MED ORDER — NITROGLYCERIN 0.4 MG SL SUBL: 0.4000 mg | SUBLINGUAL_TABLET | SUBLINGUAL | Status: DC | PRN

## 2015-11-13 MED ORDER — VITAMIN D 1000 UNITS PO TABS
1000.0000 [IU] | ORAL_TABLET | Freq: Every day | ORAL | Status: DC
  Administered 2015-11-13 – 2015-11-15 (×3): 1000 [IU] via ORAL
  Filled 2015-11-13 (×3): qty 1

## 2015-11-13 MED ORDER — ALPRAZOLAM 0.5 MG PO TABS
0.5000 mg | ORAL_TABLET | Freq: Once | ORAL | Status: AC
  Administered 2015-11-13: 0.5 mg via ORAL
  Filled 2015-11-13: qty 1

## 2015-11-13 MED ORDER — LISINOPRIL 10 MG PO TABS
10.0000 mg | ORAL_TABLET | Freq: Every day | ORAL | Status: DC
  Administered 2015-11-13 – 2015-11-15 (×3): 10 mg via ORAL
  Filled 2015-11-13 (×3): qty 1

## 2015-11-13 MED ORDER — ONDANSETRON HCL 4 MG/2ML IJ SOLN: 4.0000 mg | Freq: Four times a day (QID) | INTRAMUSCULAR | Status: DC | PRN

## 2015-11-13 MED ORDER — ATORVASTATIN CALCIUM 40 MG PO TABS
40.0000 mg | ORAL_TABLET | Freq: Every day | ORAL | Status: DC
  Administered 2015-11-14: 40 mg via ORAL
  Filled 2015-11-13: qty 1

## 2015-11-13 MED ORDER — MEMANTINE HCL ER 7 MG PO CP24
21.0000 mg | ORAL_CAPSULE | Freq: Every day | ORAL | Status: DC
  Administered 2015-11-13: 21 mg via ORAL
  Filled 2015-11-13 (×2): qty 1

## 2015-11-13 MED ORDER — CARVEDILOL 6.25 MG PO TABS
6.2500 mg | ORAL_TABLET | Freq: Two times a day (BID) | ORAL | Status: DC
  Administered 2015-11-13 – 2015-11-15 (×4): 6.25 mg via ORAL
  Filled 2015-11-13 (×4): qty 1

## 2015-11-13 MED ORDER — PANTOPRAZOLE SODIUM 40 MG PO TBEC
40.0000 mg | DELAYED_RELEASE_TABLET | Freq: Every day | ORAL | Status: DC
  Administered 2015-11-13 – 2015-11-15 (×3): 40 mg via ORAL
  Filled 2015-11-13 (×3): qty 1

## 2015-11-13 MED ORDER — POTASSIUM CHLORIDE ER 10 MEQ PO TBCR
40.0000 meq | EXTENDED_RELEASE_TABLET | Freq: Once | ORAL | Status: AC
  Administered 2015-11-13: 40 meq via ORAL
  Filled 2015-11-13: qty 4

## 2015-11-13 MED ORDER — ASPIRIN EC 81 MG PO TBEC
81.0000 mg | DELAYED_RELEASE_TABLET | Freq: Every day | ORAL | Status: DC
  Administered 2015-11-14 – 2015-11-15 (×2): 81 mg via ORAL
  Filled 2015-11-13 (×2): qty 1

## 2015-11-13 MED ORDER — ACETAMINOPHEN 325 MG PO TABS: 650.0000 mg | ORAL_TABLET | ORAL | Status: DC | PRN

## 2015-11-13 MED ORDER — POTASSIUM CHLORIDE ER 10 MEQ PO TBCR
40.0000 meq | EXTENDED_RELEASE_TABLET | Freq: Every day | ORAL | Status: DC
  Administered 2015-11-13 – 2015-11-14 (×2): 40 meq via ORAL
  Filled 2015-11-13 (×5): qty 4

## 2015-11-13 MED ORDER — LEVOTHYROXINE SODIUM 75 MCG PO TABS
75.0000 ug | ORAL_TABLET | Freq: Every day | ORAL | Status: DC
  Administered 2015-11-14: 75 ug via ORAL
  Filled 2015-11-13: qty 1

## 2015-11-13 MED ORDER — VITAMIN B-12 1000 MCG PO TABS
2000.0000 ug | ORAL_TABLET | Freq: Every day | ORAL | Status: DC
  Administered 2015-11-13 – 2015-11-15 (×3): 2000 ug via ORAL
  Filled 2015-11-13 (×3): qty 2

## 2015-11-13 NOTE — H&P (Signed)
Patient ID: [name] MRN: [id number] DOB/AGE: 1930-10-03 81 y.o.  Admit date: 11/13/2015 Primary Physician  Fae Pippin Primary Cardiologist   Cristopher Peru, MD Chief Complaint    NSTEMI   HPI: [name] is an 12F with complete heart block s/p PPM, hypertension, hypothyroidism, dementia, and hallucinations who presents on transfer with an NSTEMI. [name] reports awakening at 6 AM with 10 out of 10 chest pressure. It was associated with shortness of breath and feeling as though she couldn't breathe. There was also lightheadedness and she's noted some mild lower extremity edema over the last couple of days. He denied any nausea, vomiting, diaphoresis, orthopnea, or PND. The chest pressure did not radiate. When the symptoms did not subside she decided to present to the emergency room in [facility]. Of note, she has had shorter and less severe episodes intermittently over the last 2-3 weeks.  Upon arrival to the outside hospital [name] received aspirin 324 mg.  She also received 3 sublingual nitroglycerin tablets and lasix 20 mg.  BNP was 4940.  Her first cardiac enzyme at 8 am was less than assay.  The second one increased to 0.084 and she was started on heparin.  She has been feeling well since she received nitroglycerin and denies any recurrent chest pressure.  [name] reports easy bruising but not easy bleeding. She's had 6 falls in the last year. None of these resulted in serious injury. She does have a history of hallucinations, though she recognizes that they are hallucinations. She is being treated for dementia with Namenda. This has helped her symptoms.  Her last device check was on 08/31/15 and was unremarkable.   Review of Systems: A 12 point review of systems was obtained and was negative with exceptions as noted in the history of present illness.   Past Medical History  Diagnosis Date  . GERD   . GASTRIC ULCER   . CONSTIPATION   . Breast  cancer ([place])   . Osteoporosis   . HTN (hypertension)   . Hypothyroidism   . Vitamin D deficiency   . Arthritis   . Complete heart block (HCC)     S/P pacemaker  . Cardiomyopathy     Medications Prior to Admission  Medication Sig Dispense Refill  . aspirin 81 MG tablet Take 81 mg by mouth daily.      . carvedilol (COREG) 6.25 MG tablet TAKE 1 TABLET BY MOUTH TWICE A DAY WITH A MEAL 60 tablet 5  . cholecalciferol (VITAMIN D) 1000 UNITS tablet Take 1,000 Units by mouth daily.     . cyanocobalamin 2000 MCG tablet Take 2,000 mcg by mouth daily.      Marland Kitchen levothyroxine (SYNTHROID, LEVOTHROID) 75 MCG tablet Take 75 mcg by mouth daily before breakfast.    . lisinopril (PRINIVIL,ZESTRIL) 10 MG tablet Take 1 tablet (10 mg total) by mouth daily. 30 tablet 9  . memantine 21 MG CP24 Take 21 mg by mouth daily. 30 capsule 5  . pantoprazole (PROTONIX) 40 MG tablet Take 40 mg by mouth daily.       Allergies  Allergen Reactions  . Pramipexole Dihydrochloride     REACTION: generic mirapex give hallucinations  . Sulfonamide Derivatives     REACTION: Rash    Social History   Social History  . Marital Status: Widowed    Spouse Name: N/A  . Number of Children: 3  . Years of Education: 12   Occupational History  .  retired   Social History Main Topics  . Smoking status: Never Smoker   . Smokeless tobacco: Never Used  . Alcohol Use: No  . Drug Use: No  . Sexual Activity: Not on file   Other Topics Concern  . Not on file   Social History Narrative   Patient consumes  Caffeine daily    Family History  Problem Relation Age of Onset  . Breast cancer Mother     PHYSICAL EXAM: Filed Vitals:   11/13/15 1727  BP: 172/76  Pulse: 68  Temp: 97.8 F (36.6 C)  Resp: 16   General:  Frail, elderly woman laying in bed in no acute distress. Well appearing. No respiratory difficulty HEENT: normal Neck: supple. no JVD. Carotids 2+ bilat; no bruits. No lymphadenopathy or thryomegaly  appreciated. Cor: PMI nondisplaced. Regular rate & rhythm. No rubs, gallops or murmurs. Lungs: clear to auscultation bilaterally. Abdomen: soft, nontender, nondistended. No hepatosplenomegaly. No bruits or masses. Good bowel sounds. Extremities: no cyanosis, clubbing, rash, edema Neuro: alert & oriented x 3, cranial nerves grossly intact. moves all 4 extremities w/o difficulty. Affect pleasant.   No results found for this or any previous visit (from the past 24 hour(s)). No results found.   ECG: sinus rhythm rate 67 bpm.  RBBB.  LAFB.    Echo 09/08/13: Study Conclusions  - Left ventricle: The cavity size was normal. Wall thickness was increased in a pattern of mild LVH. Systolic function was mildly reduced. The estimated ejection fraction was in the range of 45% to 50%. Diffuse hypokinesis. Doppler parameters are consistent with abnormal left ventricular relaxation (grade 1 diastolic dysfunction). - Aortic valve: Mild regurgitation. - Mitral valve: Mild to moderate regurgitation. - Atrial septum: No defect or patent foramen ovale was identified. - Pulmonary arteries: PA peak pressure: 24mm Hg (S).  CXR 11/13/15: Mild congestive heart failure.  Cardiac silhouette is mildly enlarged.   ASSESSMENT/PLAN:   # Elevated troponin: [name] is not currently having any chest pain. She has not had any since she was seen in the emergency department in [place]. The extent of her non-ST elevation MI is unknown at this time. It seems that her troponin is likely elevated due to demand ischemia rather than ACS. Her family reports that she had a difficult time with anesthesia for her shoulder surgery and they "almost lost her." We will plan to obtain a transthoracic echo. We will also continue to cycle her cardiac enzymes. If she has very high cardiac enzymes or if abnormalities in systolic function and wall motion are noted on her echo, we will consider cardiac catheterization.  Otherwise we will plan to manage her medically. She and her family are in agreement with this plan.  - Asprin 81 mg - Continue home carvedilol and lisinopril - Start atorvastatin 40 mg daily.  Check lipids in the AM - Cycle cardiac enzymes and check echo prior to deciding on cardiac cath  # Acute congestive heart failure, type unknown: [name] presented in heart failure and seems to have improved with one dose of IV lasix.  She does not appear volume overloaded at this time.  CXR at the other hospital reported cardiomegaly and vascular congestion. - Echo pending - Continue home carvedilol and lisinopril  # Hypertension: Blood pressure is poorly controlled at this time. We will restart her carvedilol and lisinopril. We will titrate up as needed to maintain a blood pressure of less than 140/90.  # Hypothyroidism: Continue home Synthroid and check  TSH level.  # Dementia: [name] does have dementia and sometimes lost her train of thought during the interview. However she is able to provide a coherent history and recall the events of the day. It would not be unreasonable to proceed with cardiac catheterization if clinically indicated as above.  Signed: Sharol Harness, MD 11/13/2015, 6:15 PM

## 2015-11-13 NOTE — Progress Notes (Signed)
CRITICAL VALUE ALERT  Critical value received:  Potassium 2.6  Date of notification:  11/13/2015  Time of notification:  20:03  Critical value read back: yes  Nurse who received alert:  Bard Herbert  MD notified (1st page):  Dr Oval Linsey  Time of first page:  20:10  MD notified (2nd page):  Time of second page:  Responding MD: Dr Oval Linsey  Time MD responded:  20:23

## 2015-11-14 ENCOUNTER — Inpatient Hospital Stay (HOSPITAL_COMMUNITY): Payer: Medicare Other

## 2015-11-14 DIAGNOSIS — R7989 Other specified abnormal findings of blood chemistry: Secondary | ICD-10-CM

## 2015-11-14 DIAGNOSIS — I2510 Atherosclerotic heart disease of native coronary artery without angina pectoris: Secondary | ICD-10-CM

## 2015-11-14 LAB — CBC
HEMATOCRIT: 35.7 % — AB (ref 36.0–46.0)
HEMOGLOBIN: 11.5 g/dL — AB (ref 12.0–15.0)
MCH: 30.7 pg (ref 26.0–34.0)
MCHC: 32.2 g/dL (ref 30.0–36.0)
MCV: 95.2 fL (ref 78.0–100.0)
Platelets: 150 10*3/uL (ref 150–400)
RBC: 3.75 MIL/uL — ABNORMAL LOW (ref 3.87–5.11)
RDW: 14.5 % (ref 11.5–15.5)
WBC: 5 10*3/uL (ref 4.0–10.5)

## 2015-11-14 LAB — T4, FREE: FREE T4: 1.15 ng/dL — AB (ref 0.61–1.12)

## 2015-11-14 LAB — BASIC METABOLIC PANEL
ANION GAP: 6 (ref 5–15)
BUN: 17 mg/dL (ref 6–20)
CALCIUM: 8.6 mg/dL — AB (ref 8.9–10.3)
CO2: 34 mmol/L — AB (ref 22–32)
Chloride: 104 mmol/L (ref 101–111)
Creatinine, Ser: 0.97 mg/dL (ref 0.44–1.00)
GFR calc Af Amer: >60 mL/min (ref >60)
GFR calc non Af Amer: 53 mL/min — ABNORMAL LOW (ref >60)
GLUCOSE: 127 mg/dL — AB (ref 65–99)
POTASSIUM: 3.3 mmol/L — AB (ref 3.5–5.1)
Sodium: 144 mmol/L (ref 135–145)

## 2015-11-14 LAB — LIPID PANEL
CHOL/HDL RATIO: 2.3 ratio
Cholesterol: 135 mg/dL (ref 0–200)
HDL: 60 mg/dL (ref >40)
LDL Cholesterol: 67 mg/dL (ref 0–99)
Triglycerides: 42 mg/dL (ref <150)
VLDL: 8 mg/dL (ref 0–40)

## 2015-11-14 LAB — TROPONIN I
TROPONIN I: 0.03 ng/mL (ref <0.031)
Troponin I: 0.05 ng/mL — ABNORMAL HIGH (ref <0.031)

## 2015-11-14 MED ORDER — PERFLUTREN LIPID MICROSPHERE
1.0000 mL | INTRAVENOUS | Status: AC | PRN
  Filled 2015-11-14: qty 10

## 2015-11-14 MED ORDER — MEMANTINE HCL 10 MG PO TABS
10.0000 mg | ORAL_TABLET | Freq: Two times a day (BID) | ORAL | Status: DC
  Administered 2015-11-14 – 2015-11-15 (×3): 10 mg via ORAL
  Filled 2015-11-14 (×3): qty 1

## 2015-11-14 MED ORDER — LEVOTHYROXINE SODIUM 50 MCG PO TABS
50.0000 ug | ORAL_TABLET | Freq: Every day | ORAL | Status: DC
  Administered 2015-11-15: 50 ug via ORAL
  Filled 2015-11-14: qty 1

## 2015-11-14 MED ORDER — HEPARIN SODIUM (PORCINE) 5000 UNIT/ML IJ SOLN
5000.0000 [IU] | Freq: Three times a day (TID) | INTRAMUSCULAR | Status: DC
  Administered 2015-11-14 – 2015-11-15 (×3): 5000 [IU] via SUBCUTANEOUS
  Filled 2015-11-14 (×2): qty 1

## 2015-11-14 MED ORDER — MAGNESIUM SULFATE 2 GM/50ML IV SOLN
2.0000 g | Freq: Once | INTRAVENOUS | Status: AC
  Administered 2015-11-14: 2 g via INTRAVENOUS
  Filled 2015-11-14: qty 50

## 2015-11-14 NOTE — Progress Notes (Signed)
Utilization review completed.  

## 2015-11-14 NOTE — Progress Notes (Signed)
Returned phone call to patient's daughter [name] [phone]. Advised that patient had a restful night.  Also advised her of the low potassium that she arrived with and that it had come up from 2.6 to 3.3. She is presently on route to the hospital.

## 2015-11-14 NOTE — Progress Notes (Signed)
Patient: [name] / Admit Date: 11/13/2015 / Date of Encounter: 11/14/2015, 9:53 AM   Subjective: No further CP or SOB. Resting comfortably in bed.   Objective: Telemetry: NSR, occ pacing Physical Exam: Blood pressure 130/67, pulse 59, temperature 97.8 F (36.6 C), temperature source Oral, resp. rate 18, weight 101 lb 10.1 oz (46.1 kg), SpO2 94 %. General: Thin elderly WF in no acute distress. Head: Normocephalic, atraumatic, sclera non-icteric, no xanthomas, nares are without discharge. Neck: Negative for carotid bruits. JVP not elevated. Lungs: Clear bilaterally to auscultation without wheezes, rales, or rhonchi. Breathing is unlabored. Heart: RRR S1 S2 without murmurs, rubs, or gallops.  Abdomen: Soft, non-tender, non-distended with normoactive bowel sounds. No rebound/guarding. Extremities: No clubbing or cyanosis. No edema. Distal pedal pulses are 2+ and equal bilaterally. Neuro: Alert to self, thinks she's at [facility], not oriented to date. Follows commands appropriately. Psych:  Responds to questions appropriately with a normal affect.   Intake/Output Summary (Last 24 hours) at 11/14/15 0953 Last data filed at 11/14/15 0730  Gross per 24 hour  Intake      0 ml  Output    150 ml  Net   -150 ml    Inpatient Medications:  . aspirin EC  81 mg Oral Daily  . atorvastatin  40 mg Oral q1800  . carvedilol  6.25 mg Oral BID WC  . cholecalciferol  1,000 Units Oral Daily  . levothyroxine  75 mcg Oral QAC breakfast  . lisinopril  10 mg Oral Daily  . magnesium sulfate 1 - 4 g bolus IVPB  2 g Intravenous Once  . memantine  21 mg Oral Daily  . pantoprazole  40 mg Oral Daily  . potassium chloride  40 mEq Oral Daily  . cyanocobalamin  2,000 mcg Oral Daily   Infusions:    Labs:  Recent Labs  11/13/15 1855 11/14/15 0051  NA 143 144  K 2.6* 3.3*  CL 101 104  CO2 33* 34*  GLUCOSE 114* 127*  BUN 14 17  CREATININE 0.76 0.97  CALCIUM 8.4* 8.6*  MG 1.6*   --     Recent Labs  11/13/15 1855  AST 25  ALT 13*  ALKPHOS 109  BILITOT 1.8*  PROT 6.0*  ALBUMIN 3.4*    Recent Labs  11/13/15 1855 11/14/15 0051  WBC 5.9 5.0  NEUTROABS 3.9  --   HGB 13.0 11.5*  HCT 39.2 35.7*  MCV 94.9 95.2  PLT 165 150    Recent Labs  11/13/15 1855 11/14/15 0051  TROPONINI 0.06* 0.05*   Invalid input(s): POCBNP No results for input(s): HGBA1C in the last 72 hours.   Radiology/Studies:  No results found.   Assessment and Plan  89F with complete heart block s/p PPM, hypertension, hypothyroidism, dementia, hallucinations, frequent falls (6 in the last year without serious injury), chronic cardiomyopathy (EF 45-50% in 2014) who presented with chest pressure/dyspnea/elevated troponin.  1. Elevated troponin - troponin 0.08 at outside hospital. At [facility], peak troponin 0.06 thus far, next set 0.05. ?If this reflects NSTEMI versus troponin leak in setting of HF. Thus far she has not been fully heparinized here. Will f/u troponin level this AM. She is being treated with ASA, BB, and statin. Plan is for consideration of cath if enzymes go high or she has significant change in systolic function on echo; otherwise to be treated medically in light of frailty, multiple falls, and dementia. Will allow her to eat this AM since 2nd  troponin lower.  2. Acute congestive CHF, type unknown - prior LV dysfunction noted on echo in 2014, with low risk nuc in 2012 at time of dropped EF as well (prev as low as 30-35%). CHF clinically improved with a dose of IV Lasix prior to arrival. Continue BB/ACEI. Await 2D echocardiogram.  3. Hypertension - BP improved from admission.  4. Hypothyroidism - TSH slightly elevated. Check free T4. Pharmacy has updated [place] that she was on levothyroxine 50 at home, not 75. Will revert back to home dosing for now.  5. Hypokalemia/hypomagnesemia - continue potassium repletion. Will also give 2g mag sulfate. F/u levels in AM.  6. Dementia -  will fix hospital med order to reflect what she is on at home.  DVT ppx: SQ heparin  Signed, Melina Copa PA-C Pager: [phone]   I have examined the patient and reviewed assessment and plan and discussed with patient.  Agree with above as stated.  No symptoms. I agree that conservative management is likely the best strategy unless enzymes changes or there is a focal wall motion abnormality.  She appears frail and her sx appear controlled.  Smrithi Pigford S.

## 2015-11-14 NOTE — Progress Notes (Signed)
  Echocardiogram 2D Echocardiogram has been performed.  Joelene Millin 11/14/2015, 1:16 PM

## 2015-11-15 DIAGNOSIS — R0602 Shortness of breath: Secondary | ICD-10-CM

## 2015-11-15 DIAGNOSIS — I5043 Acute on chronic combined systolic (congestive) and diastolic (congestive) heart failure: Secondary | ICD-10-CM

## 2015-11-15 DIAGNOSIS — R931 Abnormal findings on diagnostic imaging of heart and coronary circulation: Secondary | ICD-10-CM

## 2015-11-15 DIAGNOSIS — R079 Chest pain, unspecified: Secondary | ICD-10-CM | POA: Diagnosis present

## 2015-11-15 LAB — BASIC METABOLIC PANEL
Anion gap: 4 — ABNORMAL LOW (ref 5–15)
BUN: 21 mg/dL — ABNORMAL HIGH (ref 6–20)
CHLORIDE: 107 mmol/L (ref 101–111)
CO2: 31 mmol/L (ref 22–32)
CREATININE: 0.81 mg/dL (ref 0.44–1.00)
Calcium: 8.4 mg/dL — ABNORMAL LOW (ref 8.9–10.3)
Glucose, Bld: 104 mg/dL — ABNORMAL HIGH (ref 65–99)
POTASSIUM: 4.4 mmol/L (ref 3.5–5.1)
SODIUM: 142 mmol/L (ref 135–145)

## 2015-11-15 LAB — CBC
HEMATOCRIT: 35.1 % — AB (ref 36.0–46.0)
Hemoglobin: 11.1 g/dL — ABNORMAL LOW (ref 12.0–15.0)
MCH: 30.6 pg (ref 26.0–34.0)
MCHC: 31.6 g/dL (ref 30.0–36.0)
MCV: 96.7 fL (ref 78.0–100.0)
Platelets: 150 10*3/uL (ref 150–400)
RBC: 3.63 MIL/uL — ABNORMAL LOW (ref 3.87–5.11)
RDW: 14.8 % (ref 11.5–15.5)
WBC: 4.3 10*3/uL (ref 4.0–10.5)

## 2015-11-15 LAB — MAGNESIUM: MAGNESIUM: 2.2 mg/dL (ref 1.7–2.4)

## 2015-11-15 LAB — HEMOGLOBIN A1C
Hgb A1c MFr Bld: 5.6 % (ref 4.8–5.6)
Mean Plasma Glucose: 114 mg/dL

## 2015-11-15 MED ORDER — FUROSEMIDE 20 MG PO TABS: 20.0000 mg | ORAL_TABLET | Freq: Every day | ORAL | Status: AC

## 2015-11-15 MED ORDER — FUROSEMIDE 20 MG PO TABS
20.0000 mg | ORAL_TABLET | Freq: Every day | ORAL | Status: DC
  Administered 2015-11-15: 20 mg via ORAL
  Filled 2015-11-15: qty 1

## 2015-11-15 MED ORDER — ALPRAZOLAM 0.5 MG PO TABS
0.5000 mg | ORAL_TABLET | Freq: Once | ORAL | Status: DC
  Filled 2015-11-15: qty 1

## 2015-11-15 MED ORDER — POTASSIUM CHLORIDE CRYS ER 20 MEQ PO TBCR: 20.0000 meq | EXTENDED_RELEASE_TABLET | Freq: Every day | ORAL | Status: AC

## 2015-11-15 MED ORDER — POTASSIUM CHLORIDE CRYS ER 20 MEQ PO TBCR
20.0000 meq | EXTENDED_RELEASE_TABLET | Freq: Every day | ORAL | Status: DC
  Administered 2015-11-15: 20 meq via ORAL
  Filled 2015-11-15: qty 1

## 2015-11-15 NOTE — Progress Notes (Signed)
SUBJECTIVE:  No CP or SHOB.  Wants to go home.  OBJECTIVE:   Vitals:   Filed Vitals:   11/14/15 1019 11/14/15 1315 11/14/15 2023 11/15/15 0416  BP: 154/75 159/69 138/71 133/75  Pulse:  69 60 66  Temp:  98 F (36.7 C) 97.6 F (36.4 C) 97.7 F (36.5 C)  TempSrc:  Oral Oral Oral  Resp:  18 17 17   Weight:    100 lb 1.4 oz (45.4 kg)  SpO2:  98% 96% 95%   I&O's:   Intake/Output Summary (Last 24 hours) at 11/15/15 [id number] Last data filed at 11/15/15 0730  Gross per 24 hour  Intake    720 ml  Output   1100 ml  Net   -380 ml   TELEMETRY: Reviewed telemetry pt in NSR:     PHYSICAL EXAM General: Well developed, well nourished, in no acute distress Head:   Normal cephalic and atramatic  Lungs:  Clear bilaterally to auscultation. Heart:  HRRR S1 S2  No JVD.   Abdomen: abdomen soft and non-tender Msk:  Back normal,  Normal strength and tone for age. Extremities:   No edema.   Neuro: Alert and oriented. Psych:  Normal affect, responds appropriately Skin: No rash   LABS: Basic Metabolic Panel:  Recent Labs  11/13/15 1855 11/14/15 0051 11/15/15 0310  NA 143 144 142  K 2.6* 3.3* 4.4  CL 101 104 107  CO2 33* 34* 31  GLUCOSE 114* 127* 104*  BUN 14 17 21*  CREATININE 0.76 0.97 0.81  CALCIUM 8.4* 8.6* 8.4*  MG 1.6*  --  2.2   Liver Function Tests:  Recent Labs  11/13/15 1855  AST 25  ALT 13*  ALKPHOS 109  BILITOT 1.8*  PROT 6.0*  ALBUMIN 3.4*   No results for input(s): LIPASE, AMYLASE in the last 72 hours. CBC:  Recent Labs  11/13/15 1855 11/14/15 0051 11/15/15 0310  WBC 5.9 5.0 4.3  NEUTROABS 3.9  --   --   HGB 13.0 11.5* 11.1*  HCT 39.2 35.7* 35.1*  MCV 94.9 95.2 96.7  PLT 165 150 150   Cardiac Enzymes:  Recent Labs  11/13/15 1855 11/14/15 0051 11/14/15 1135  TROPONINI 0.06* 0.05* 0.03   BNP: Invalid input(s): POCBNP D-Dimer: No results for input(s): DDIMER in the last 72 hours. Hemoglobin A1C:  Recent Labs  11/14/15 1135  HGBA1C  5.6   Fasting Lipid Panel:  Recent Labs  11/14/15 0051  CHOL 135  HDL 60  LDLCALC 67  TRIG 42  CHOLHDL 2.3   Thyroid Function Tests:  Recent Labs  11/13/15 1855  TSH 6.770*   Anemia Panel: No results for input(s): VITAMINB12, FOLATE, FERRITIN, TIBC, IRON, RETICCTPCT in the last 72 hours. Coag Panel:   Lab Results  Component Value Date   INR 1.21 11/13/2015   INR 1.04 10/01/2011   INR 0.99 07/25/2010    RADIOLOGY: No results found.    ASSESSMENT: [name]:    1) Acute on chronic systolic heart failure. Echo reviewed.  No change in LV function.  I suspect her sx and troponin elevation were related to fluid overload.  Plan to discharge on Lasix 20 mg daily with some supplemental potassium, 20 mEq daily.  Decrease salt intake.  May hold todays lasix since she will be have a lengthy car ride home. No further ischemic eval planned given that her troponin is back to normal.  HTN: controlled.  I spoke to [name], her daughter, and updated her.  The patient will stay with her for now.  Nurse to ambulate.  If PT is needed, can order.  [name], MD  11/15/2015  8:33 AM

## 2015-11-15 NOTE — Progress Notes (Signed)
Walked with patient today. Patient steady on her feet with a cane or walker. Has a cane at home.   Genna Casimir, Mervin Kung RN

## 2015-11-15 NOTE — Progress Notes (Signed)
Reviewed discharge instructions with patient and family. No issues at present. IV removed by the tech. Escorted out via wheelchair to be discharged.   Jayd Cadieux, Mervin Kung RN

## 2015-11-15 NOTE — Discharge Summary (Signed)
Physician Discharge Summary    Cardiologist:  Lovena Le  Patient ID: [name] MRN: [id number] DOB/AGE: 06-12-1930 81 y.o.  Admit date: 11/13/2015 Discharge date: 11/15/2015  Admission Diagnoses:  NSTEMI, Acute on Chronic combined sys and diastolic HF  Discharge Diagnoses:  Active Problems:   Cardiomyopathy, nonischemic (HCC)   NSTEMI (non-ST elevated myocardial infarction) (HCC)   Chest pain   Acute on chronic combined systolic and diastolic HF (heart failure) ([place])   Essential HTN   Discharged Condition: stable  Hospital Course:   [name] is an 81F with complete heart block s/p PPM, hypertension, hypothyroidism, dementia, and hallucinations who presented as a transfer with an NSTEMI. [name] reports awakening at 6 AM with 10 out of 10 chest pressure.  It was associated with shortness of breath and feeling as though she couldn't breathe. There was also lightheadedness and she's noted some mild lower extremity edema over the last couple of days. He denied any nausea, vomiting, diaphoresis, orthopnea, or PND.  The chest pressure did not radiate. When the symptoms did not subside she decided to present to the emergency room in [facility]. Of note, she has had shorter and less severe episodes intermittently over the last 2-3 weeks.  Upon arrival to the outside hospital [name] received aspirin 324 mg. She also received 3 sublingual nitroglycerin tablets and lasix 20 mg. BNP was 4940. Her first cardiac enzyme at 8 am was less than assay. The second one increased to 0.084 and she was started on heparin. She has been feeling well since she received nitroglycerin and denies any recurrent chest pressure.   [name] reports easy bruising but not easy bleeding. She's had 6 falls in the last year. None of these resulted in serious injury. She does have a history of hallucinations, though she recognizes that they are hallucinations. She is being treated for dementia  with Namenda. This has helped her symptoms.  Her last device check was on 08/31/15 and was unremarkable.   The patient was admitted and given a dose of IV lasix and had improvement.  Net fluids: were -0.4L.  She was continued on Coreg and lisinopril.  Troponin trended down from peak in [facility].  This was thought to be demand ischemia related to CHF.  Statin was added.  Echocardiogram revealed an EF of 40-45% with diffuse hypokinesis, G2DD, mild AI, MR.  Trivial pulmonic valve regurg.  She will be discharged on lasix 20mg  daily with 42meq of potassium.  No further ischemic evaluation planned. BP was controlled.  DC statin as lipid panel looks excellent.  The patient was seen by Dr. Irish Lack who felt she was stable for DC home.      Consults: None  Significant Diagnostic Studies:  Echocardiogram Study Conclusions  - Left ventricle: The cavity size was normal. Systolic function was mildly to moderately reduced. The estimated ejection fraction was in the range of 40% to 45%. Diffuse hypokinesis. Features are consistent with a pseudonormal left ventricular filling pattern, with concomitant abnormal relaxation and increased filling pressure (grade 2 diastolic dysfunction). Doppler parameters are consistent with high ventricular filling pressure. - Aortic valve: Trileaflet; mildly thickened, mildly calcified leaflets. There was mild regurgitation. - Mitral valve: There was mild regurgitation. - Pulmonic valve: There was trivial regurgitation.  Lipid Panel     Component Value Date/Time   CHOL 135 11/14/2015 0051   TRIG 42 11/14/2015 0051   HDL 60 11/14/2015 0051   CHOLHDL 2.3 11/14/2015 0051   VLDL 8  11/14/2015 0051   LDLCALC 67 11/14/2015 0051    Treatments: See above  Discharge Exam: Blood pressure 133/75, pulse 66, temperature 97.7 F (36.5 C), temperature source Oral, resp. rate 17, weight 100 lb 1.4 oz (45.4 kg), SpO2 95 %.   Disposition: 01-Home or Self Care       Discharge Instructions    Diet - low sodium heart healthy    Complete by:  As directed      Discharge instructions    Complete by:  As directed   Monitor your weight every morning.  If you gain 3 pounds in 24 hours, or 5 pounds in a week, call the office for instructions.     Increase activity slowly    Complete by:  As directed             Medication List    TAKE these medications        aspirin EC 81 MG tablet  Take 81 mg by mouth daily.     carvedilol 6.25 MG tablet  Commonly known as:  COREG  TAKE 1 TABLET BY MOUTH TWICE A DAY WITH A MEAL     cholecalciferol 1000 UNITS tablet  Commonly known as:  VITAMIN D  Take 1,000 Units by mouth 2 (two) times daily.     furosemide 20 MG tablet  Commonly known as:  LASIX  Take 1 tablet (20 mg total) by mouth daily.  Start taking on:  11/16/2015     levothyroxine 50 MCG tablet  Commonly known as:  SYNTHROID, LEVOTHROID  Take 50 mcg by mouth daily.     lisinopril 10 MG tablet  Commonly known as:  PRINIVIL,ZESTRIL  Take 1 tablet (10 mg total) by mouth daily.     memantine 10 MG tablet  Commonly known as:  NAMENDA  Take 10 mg by mouth 2 (two) times daily.     pantoprazole 40 MG tablet  Commonly known as:  PROTONIX  Take 40 mg by mouth daily.     potassium chloride SA 20 MEQ tablet  Commonly known as:  K-DUR,KLOR-CON  Take 1 tablet (20 mEq total) by mouth daily.  Start taking on:  11/16/2015     vitamin B-12 1000 MCG tablet  Commonly known as:  CYANOCOBALAMIN  Take 1,000 mcg by mouth daily.       Follow-up Information    Follow up with Richardson Dopp, PA-C On 11/29/2015.   Specialties:  Physician Assistant, Radiology, Interventional Cardiology   Why:  10:30  AM   Contact information:   [id number] [place] [address] [phone]      Greater than 30 minutes was spent completing the patient's discharge.   SignedTarri Fuller, [place] 11/15/2015, 9:13 AM  I have examined the patient and  reviewed assessment and plan and discussed with patient.  Agree with above as stated.  Episode of fluid overload.  Lasix and potassium started as an outpatient. Echo unchanged. D/w daughter.  Please see my note from earlier today.  Discharge today.  Korde Jeppsen S.

## 2015-11-16 ENCOUNTER — Telehealth: Payer: Self-pay | Admitting: Internal Medicine

## 2015-11-16 NOTE — Telephone Encounter (Signed)
She is to be taking both Carvedilol and Lisinopril per the discharge summary

## 2015-11-16 NOTE — Telephone Encounter (Signed)
New problem   When pt went to pick up her prescriptions she didn't have a prescription for her Lasix. Please advise

## 2015-11-16 NOTE — Telephone Encounter (Signed)
F/u  Pt grandaughter calling to speak w/ RN to clarify BP medications given during recent hospital stay- she stated she will wait to give her meds today until she hears from RN. Please call [phone] and discuss.

## 2015-11-28 NOTE — Progress Notes (Signed)
Cardiology Office Note   Date:  11/29/2015   ID:  JEREMIAH OSTHEIMER, DOB 12-Oct-1930, MRN [id number]   Patient Care Team: Cyndi Bender, PA-C as PCP - General (Physician Assistant) Evans Lance, MD as Consulting Physician (Cardiology)    Chief Complaint  Patient presents with  . Hospitalization Follow-up    s/p NSTEMI; a/c CHF  . Congestive Heart Failure     History of Present Illness: [name] is a 81 y.o. female with a hx of symptomatic bradycardia, s/p PPM, HTN, DCM (presumed to be non-ischemic), hypothyroidism, dementia with a hx of hallucinations.  EF in 2014 was 45-50% by echo.    Admitted 11/13-11/15 with a NSTEMI.  She was transferred to [facility] after presenting to [facility] with chest pressure and dyspnea.  BNP was elevated and her 2nd Troponin level was elevated (0.084).  She did have improvement with NTG and IV Lasix.   Her Troponin level at [facility] was just minimally elevated with a flat trend.  Elevated Troponin was thought to be 2/2 demand ischemia.  EF was stable on repeat Echo and no RWMA.  Conservative management was felt to be the best option and no ischemic testing was pursued.    Here with her daughter.  She is quite confused about her medications.  It is not really clear what she is taking. After spending a significant amount of time with the patient and her daughter, we think she may be taking her Lasix and K+.  She is definitely not taking her Coreg, Lisinopril or ASA.  She denies any further chest pain, dyspnea, syncope, orthopnea, PND, edema.  Denies cough, wheezing, bleeding.    Studies/Reports Reviewed Today:  Echo 11/14/15 EF 40-45%, diffuse HK, grade 2 diastolic dysfunction, mild AI, mild MR, trivial PI  Echo 9/14 Mild LVH, EF 45-50%, diffuse HK, grade 1 diastolic dysfunction, mild AI, mild to moderate MR, PASP 35 mmHg  Myoview 10/12 Low risk stress nuclear study with small fixed apical defect suggestive of small prior infarct vs  thinning; no ischemia.  EF 62%   Past Medical History  Diagnosis Date  . GERD   . GASTRIC ULCER   . CONSTIPATION   . Breast cancer ([place])   . Osteoporosis   . HTN (hypertension)   . Hypothyroidism   . Vitamin D deficiency   . Arthritis   . Complete heart block (HCC)     S/P pacemaker  . Cardiomyopathy     Past Surgical History  Procedure Laterality Date  . Cholecystectomy    . Doppler echocardiography  2007     Current Outpatient Prescriptions  Medication Sig Dispense Refill  . aspirin EC 81 MG tablet Take 81 mg by mouth daily.    . carvedilol (COREG) 6.25 MG tablet TAKE 1 TABLET BY MOUTH TWICE A DAY WITH A MEAL 60 tablet 5  . cholecalciferol (VITAMIN D) 1000 UNITS tablet Take 1,000 Units by mouth 2 (two) times daily.     . furosemide (LASIX) 20 MG tablet Take 1 tablet (20 mg total) by mouth daily. 30 tablet 11  . levothyroxine (SYNTHROID, LEVOTHROID) 50 MCG tablet Take 50 mcg by mouth daily.    Marland Kitchen lisinopril (PRINIVIL,ZESTRIL) 10 MG tablet Take 1 tablet (10 mg total) by mouth daily. 30 tablet 9  . memantine (NAMENDA) 10 MG tablet Take 10 mg by mouth 2 (two) times daily.    . pantoprazole (PROTONIX) 40 MG tablet Take 40 mg by mouth daily.    Marland Kitchen  potassium chloride SA (K-DUR,KLOR-CON) 20 MEQ tablet Take 1 tablet (20 mEq total) by mouth daily. 30 tablet 11  . vitamin B-12 (CYANOCOBALAMIN) 1000 MCG tablet Take 1,000 mcg by mouth daily.     No current facility-administered medications for this visit.    Allergies:   Pramipexole dihydrochloride and Sulfonamide derivatives    Social History:   Social History   Social History  . Marital Status: Widowed    Spouse Name: N/A  . Number of Children: 3  . Years of Education: 12   Occupational History  .      retired   Social History Main Topics  . Smoking status: Never Smoker   . Smokeless tobacco: Never Used  . Alcohol Use: No  . Drug Use: No  . Sexual Activity: Not Asked   Other Topics Concern  . None   Social  History Narrative   Patient consumes  Caffeine daily     Family History:   Family History  Problem Relation Age of Onset  . Breast cancer Mother       ROS:   Please see the history of present illness.   Review of Systems  All other systems reviewed and are negative.     PHYSICAL EXAM: VS:  BP 160/80 mmHg  Pulse 75  Ht 5' (1.524 m)  Wt 100 lb 9.6 oz (45.632 kg)  BMI 19.65 kg/m2     Wt Readings from Last 3 Encounters:  11/29/15 100 lb 9.6 oz (45.632 kg)  11/15/15 100 lb 1.4 oz (45.4 kg)  11/03/15 104 lb (47.174 kg)     GEN: Well nourished, well developed, in no acute distress HEENT: normal Neck: no JVD, no masses Cardiac:  Normal S1/S2, RRR; no murmur ,  no rubs or gallops, no edema   Respiratory:  clear to auscultation bilaterally, no wheezing, rhonchi or rales. GI: soft, nontender, nondistended, + BS MS: no deformity or atrophy Skin: warm and dry  Neuro:  CNs II-XII intact, Strength and sensation are intact Psych: Normal affect   EKG:  EKG is ordered today.  It demonstrates:   NSR, HR 75, LAD, RBBB, TWI, aVL, QTc 482 ms   Recent Labs: 11/13/2015: ALT 13*; B Natriuretic Peptide 1217.1*; TSH 6.770* 11/15/2015: Hemoglobin 11.1*; Magnesium 2.2; Platelets 150 11/29/2015: BUN 26*; Creat 0.80; Potassium 3.9; Sodium 143    Lipid Panel    Component Value Date/Time   CHOL 135 11/14/2015 0051   TRIG 42 11/14/2015 0051   HDL 60 11/14/2015 0051   CHOLHDL 2.3 11/14/2015 0051   VLDL 8 11/14/2015 0051   LDLCALC 67 11/14/2015 0051      ASSESSMENT AND PLAN:  1. Chronic Combined Systolic and Diastolic CHF:  Volume appears stable. She is not adherent to her medications.  We discussed the importance of taking all of her medications. I have encouraged her to get her daughter to help her remember to take her medications. We discussed creating a chart to help or using a different set of pill boxes.  Check BMET today.  She should resume taking Lasix, K+, coreg, Lisinopril.   Repeat BMET 1 week.   2. Dilated Cardiomyopathy:  Presumed to be NICM.  Continue beta-blocker, ACE inhibitor.   3. HTN:  Uncontrolled. Resume medications as noted.   4. S/p Pacemaker:  FU with EP as planned.      Medication Changes: Current medicines are reviewed at length with the patient today.  Concerns regarding medicines are as outlined above.  The following  changes have been made:   Discontinued Medications   No medications on file   Modified Medications   No medications on file   New Prescriptions   No medications on file   Labs/ tests ordered today include:   Orders Placed This Encounter  Procedures  . Basic Metabolic Panel (BMET)  . EKG 12-Lead     Disposition:    I spent > 30 minutes with the patient today discussing and coordinating her care. Face to face time was > 50%. FU with me in 6 weeks.     Signed, Versie Starks, MHS 11/29/2015 5:48 PM    [facility] [address], [place], [place]  [address] Phone: [phone]; Fax: [phone]

## 2015-11-29 ENCOUNTER — Ambulatory Visit (INDEPENDENT_AMBULATORY_CARE_PROVIDER_SITE_OTHER): Payer: Medicare Other | Admitting: Physician Assistant

## 2015-11-29 ENCOUNTER — Encounter: Payer: Self-pay | Admitting: Physician Assistant

## 2015-11-29 VITALS — BP 160/80 | HR 75 | Ht 60.0 in | Wt 100.6 lb

## 2015-11-29 DIAGNOSIS — I5042 Chronic combined systolic (congestive) and diastolic (congestive) heart failure: Secondary | ICD-10-CM

## 2015-11-29 DIAGNOSIS — I429 Cardiomyopathy, unspecified: Secondary | ICD-10-CM | POA: Diagnosis not present

## 2015-11-29 DIAGNOSIS — I10 Essential (primary) hypertension: Secondary | ICD-10-CM | POA: Diagnosis not present

## 2015-11-29 DIAGNOSIS — I428 Other cardiomyopathies: Secondary | ICD-10-CM

## 2015-11-29 DIAGNOSIS — Z950 Presence of cardiac pacemaker: Secondary | ICD-10-CM

## 2015-11-29 LAB — BASIC METABOLIC PANEL
BUN: 26 mg/dL — ABNORMAL HIGH (ref 7–25)
CALCIUM: 9 mg/dL (ref 8.6–10.4)
CO2: 30 mmol/L (ref 20–31)
Chloride: 106 mmol/L (ref 98–110)
Creat: 0.8 mg/dL (ref 0.60–0.88)
GLUCOSE: 98 mg/dL (ref 65–99)
POTASSIUM: 3.9 mmol/L (ref 3.5–5.3)
SODIUM: 143 mmol/L (ref 135–146)

## 2015-11-29 NOTE — Patient Instructions (Signed)
Medication Instructions:  Your physician has recommended you make the following change in your medication:  1.  RESTART Coreg 6.25 mg taking 1 tablet twice a day 2.  RESTART Lisinopril 10 mg taking 1 tablet daily 3.  RESTART the Aspirin 81 mg taking 1 tablet daily   Labwork: TODAY:  BMET  1-2 WEEKS WITH YOUR PCP:  BMET  Testing/Procedures: None ordered  Follow-Up: Your physician recommends that you schedule a follow-up appointment in: [place], PA-C   Any Other Special Instructions Will Be Listed Below (If Applicable).     If you need a refill on your cardiac medications before your next appointment, please call your pharmacy.

## 2015-11-30 ENCOUNTER — Telehealth: Payer: Self-pay | Admitting: *Deleted

## 2015-11-30 NOTE — Telephone Encounter (Signed)
DPR on file for daughter Jocelyn Lamer who has been notified of lab results by phone with verbal understanding.

## 2015-12-09 DIAGNOSIS — Z23 Encounter for immunization: Secondary | ICD-10-CM | POA: Diagnosis not present

## 2015-12-09 DIAGNOSIS — I10 Essential (primary) hypertension: Secondary | ICD-10-CM | POA: Diagnosis not present

## 2015-12-14 ENCOUNTER — Other Ambulatory Visit: Payer: Self-pay | Admitting: Physician Assistant

## 2015-12-14 ENCOUNTER — Encounter: Payer: Self-pay | Admitting: Physician Assistant

## 2015-12-15 NOTE — Telephone Encounter (Signed)
MED REFILLS NOT DUE SPOKE WITH PHARMACIST, COMPUTER SENT REQUEST IN ERROR

## 2015-12-17 ENCOUNTER — Other Ambulatory Visit: Payer: Self-pay | Admitting: Physician Assistant

## 2015-12-28 ENCOUNTER — Ambulatory Visit: Payer: Medicare Other | Admitting: Neurology

## 2016-01-09 NOTE — Progress Notes (Signed)
Cardiology Office Note    Date:  01/10/2016   ID:  [name], DOB 03/22/1930, MRN [id number]  PCP:  [name]  Cardiologist:  Dr. Cristopher Palmer   Electrophysiologist:  Dr. Cristopher Palmer   Chief Complaint  Patient presents with  . Follow-up  . Congestive Heart Failure    History of Present Illness:  [name] is a 82 y.o. female with a hx of symptomatic bradycardia, s/p PPM, HTN, DCM (presumed to be non-ischemic), hypothyroidism, dementia with a hx of hallucinations. EF in 2014 was 45-50% by echo.   Admitted 11/16 with a NSTEMI (transferred to [facility] from [facility]).  Elevated Troponin was thought to be 2/2 demand ischemia. EF was stable on repeat Echo and no RWMA. Conservative management was felt to be the best option and no ischemic testing was pursued.    Seen in follow-up 11/29/15. Patient was confused about her medications. After spending a considerable amount of time reviewing her medications, we concluded that she was likely taking her Lasix and potassium but not taking Coreg, lisinopril or aspirin.  I asked her to resume all of her medications.    Returns for FU.     Past Medical History  Diagnosis Date  . GERD   . GASTRIC ULCER   . CONSTIPATION   . Breast cancer ([place])   . Osteoporosis   . HTN (hypertension)   . Hypothyroidism   . Vitamin D deficiency   . Arthritis   . Complete heart block (HCC)     S/P pacemaker  . Cardiomyopathy     Past Surgical History  Procedure Laterality Date  . Cholecystectomy    . Doppler echocardiography  2007    Current Outpatient Prescriptions  Medication Sig Dispense Refill  . aspirin EC 81 MG tablet Take 81 mg by mouth daily.    . carvedilol (COREG) 6.25 MG tablet TAKE 1 TABLET BY MOUTH TWICE A DAY WITH A MEAL 60 tablet 5  . cholecalciferol (VITAMIN D) 1000 UNITS tablet Take 1,000 Units by mouth 2 (two) times daily.     . furosemide (LASIX) 20 MG tablet Take 1 tablet (20 mg total) by mouth  daily. 30 tablet 11  . levothyroxine (SYNTHROID, LEVOTHROID) 50 MCG tablet Take 50 mcg by mouth daily.    Marland Kitchen lisinopril (PRINIVIL,ZESTRIL) 10 MG tablet Take 1 tablet (10 mg total) by mouth daily. 30 tablet 9  . memantine (NAMENDA) 10 MG tablet Take 10 mg by mouth 2 (two) times daily.    . pantoprazole (PROTONIX) 40 MG tablet Take 40 mg by mouth daily.    . potassium chloride SA (K-DUR,KLOR-CON) 20 MEQ tablet Take 1 tablet (20 mEq total) by mouth daily. 30 tablet 11  . vitamin B-12 (CYANOCOBALAMIN) 1000 MCG tablet Take 1,000 mcg by mouth daily.     No current facility-administered medications for this visit.    Allergies:   Pramipexole dihydrochloride and Sulfonamide derivatives   Social History   Social History  . Marital Status: Widowed    Spouse Name: N/A  . Number of Children: 3  . Years of Education: 12   Occupational History  .      retired   Social History Main Topics  . Smoking status: Never Smoker   . Smokeless tobacco: Never Used  . Alcohol Use: No  . Drug Use: No  . Sexual Activity: Not on file   Other Topics Concern  . Not on file   Social History Narrative  Patient consumes  Caffeine daily     Family History:  The patient's family history includes Breast cancer in her mother.   ROS:   Please see the history of present illness.    ROS All other systems reviewed and are negative.   PHYSICAL EXAM:   VS:  There were no vitals taken for this visit.   GEN: Well nourished, well developed, in no acute distress HEENT: normal Neck: no JVD, no masses Cardiac: Normal S1/S2, RRR; no murmurs, rubs, or gallops, no edema;   carotid bruits,   Respiratory:  clear to auscultation bilaterally; no wheezing, rhonchi or rales GI: soft, nontender, nondistended, + BS MS: no deformity or atrophy Skin: warm and dry, no rash Neuro:  Bilateral strength equal, no focal deficits  Psych: Alert and oriented x 3, normal affect  Wt Readings from Last 3 Encounters:  11/29/15 100  lb 9.6 oz (45.632 kg)  11/15/15 100 lb 1.4 oz (45.4 kg)  11/03/15 104 lb (47.174 kg)      Studies/Labs Reviewed:   EKG:  EKG is  ordered today.  The ekg ordered today demonstrates   Recent Labs: 11/13/2015: ALT 13*; B Natriuretic Peptide 1217.1*; TSH 6.770* 11/15/2015: Hemoglobin 11.1*; Magnesium 2.2; Platelets 150 11/29/2015: BUN 26*; Creat 0.80; Potassium 3.9; Sodium 143   Recent Lipid Panel    Component Value Date/Time   CHOL 135 11/14/2015 0051   TRIG 42 11/14/2015 0051   HDL 60 11/14/2015 0051   CHOLHDL 2.3 11/14/2015 0051   VLDL 8 11/14/2015 0051   LDLCALC 67 11/14/2015 0051    Additional studies/ records that were reviewed today include:   Echo 11/14/15 EF 40-45%, diffuse HK, grade 2 diastolic dysfunction, mild AI, mild MR, trivial PI  Echo 9/14 Mild LVH, EF 45-50%, diffuse HK, grade 1 diastolic dysfunction, mild AI, mild to moderate MR, PASP 35 mmHg  Myoview 10/12 Low risk stress nuclear study with small fixed apical defect suggestive of small prior infarct vs thinning; no ischemia. EF 62%    ASSESSMENT:    1. Chronic combined systolic and diastolic CHF (congestive heart failure) (HCC)   2. Cardiomyopathy, nonischemic ([place])   3. Essential hypertension   4. Pacemaker     PLAN:  In order of problems listed above:  1. Chronic Combined Systolic and Diastolic CHF -  Volume appears stable.    2. Dilated Cardiomyopathy - Presumed to be NICM. Continue beta-blocker, ACE inhibitor.   3. HTN -   4. S/p Pacemaker: FU with EP as planned.     Medication Adjustments/Labs and Tests Ordered: Current medicines are reviewed at length with the patient today.  Concerns regarding medicines are outlined above.  Medication changes, Labs and Tests ordered today are listed below. There are no Patient Instructions on file for this visit.    Signed, Richardson Dopp, PA-C  01/10/2016 8:58 AM    [facility] [place], [place], [place]   [address] Phone: [phone]; Fax: [phone]  This encounter was created in error - please disregard.

## 2016-01-10 ENCOUNTER — Encounter: Payer: Medicare Other | Admitting: Physician Assistant

## 2016-01-11 ENCOUNTER — Encounter: Payer: Self-pay | Admitting: Physician Assistant

## 2016-01-13 ENCOUNTER — Inpatient Hospital Stay (HOSPITAL_COMMUNITY)
Admission: EM | Admit: 2016-01-13 | Discharge: 2016-01-15 | DRG: 683 | Disposition: A | Discharge status: Home Health | Payer: Medicare Other | Attending: Family Medicine | Admitting: Family Medicine

## 2016-01-13 ENCOUNTER — Emergency Department (HOSPITAL_COMMUNITY): Payer: Medicare Other

## 2016-01-13 DIAGNOSIS — E875 Hyperkalemia: Secondary | ICD-10-CM | POA: Insufficient documentation

## 2016-01-13 DIAGNOSIS — I5042 Chronic combined systolic (congestive) and diastolic (congestive) heart failure: Secondary | ICD-10-CM | POA: Diagnosis present

## 2016-01-13 DIAGNOSIS — F0390 Unspecified dementia without behavioral disturbance: Secondary | ICD-10-CM | POA: Insufficient documentation

## 2016-01-13 DIAGNOSIS — T68XXXA Hypothermia, initial encounter: Secondary | ICD-10-CM | POA: Diagnosis not present

## 2016-01-13 DIAGNOSIS — N179 Acute kidney failure, unspecified: Principal | ICD-10-CM | POA: Diagnosis present

## 2016-01-13 DIAGNOSIS — K219 Gastro-esophageal reflux disease without esophagitis: Secondary | ICD-10-CM | POA: Diagnosis present

## 2016-01-13 DIAGNOSIS — R443 Hallucinations, unspecified: Secondary | ICD-10-CM | POA: Diagnosis present

## 2016-01-13 DIAGNOSIS — R1030 Lower abdominal pain, unspecified: Secondary | ICD-10-CM | POA: Diagnosis not present

## 2016-01-13 DIAGNOSIS — I429 Cardiomyopathy, unspecified: Secondary | ICD-10-CM | POA: Diagnosis present

## 2016-01-13 DIAGNOSIS — A084 Viral intestinal infection, unspecified: Secondary | ICD-10-CM | POA: Diagnosis present

## 2016-01-13 DIAGNOSIS — R197 Diarrhea, unspecified: Secondary | ICD-10-CM | POA: Insufficient documentation

## 2016-01-13 DIAGNOSIS — I2510 Atherosclerotic heart disease of native coronary artery without angina pectoris: Secondary | ICD-10-CM | POA: Diagnosis present

## 2016-01-13 DIAGNOSIS — I252 Old myocardial infarction: Secondary | ICD-10-CM

## 2016-01-13 DIAGNOSIS — M810 Age-related osteoporosis without current pathological fracture: Secondary | ICD-10-CM | POA: Diagnosis present

## 2016-01-13 DIAGNOSIS — Z950 Presence of cardiac pacemaker: Secondary | ICD-10-CM

## 2016-01-13 DIAGNOSIS — E039 Hypothyroidism, unspecified: Secondary | ICD-10-CM | POA: Diagnosis present

## 2016-01-13 DIAGNOSIS — Z681 Body mass index (BMI) 19 or less, adult: Secondary | ICD-10-CM

## 2016-01-13 DIAGNOSIS — I4510 Unspecified right bundle-branch block: Secondary | ICD-10-CM | POA: Diagnosis present

## 2016-01-13 DIAGNOSIS — Z853 Personal history of malignant neoplasm of breast: Secondary | ICD-10-CM | POA: Diagnosis not present

## 2016-01-13 DIAGNOSIS — Z8711 Personal history of peptic ulcer disease: Secondary | ICD-10-CM

## 2016-01-13 DIAGNOSIS — E860 Dehydration: Secondary | ICD-10-CM | POA: Diagnosis present

## 2016-01-13 DIAGNOSIS — D649 Anemia, unspecified: Secondary | ICD-10-CM | POA: Diagnosis present

## 2016-01-13 DIAGNOSIS — R630 Anorexia: Secondary | ICD-10-CM | POA: Insufficient documentation

## 2016-01-13 DIAGNOSIS — R634 Abnormal weight loss: Secondary | ICD-10-CM | POA: Insufficient documentation

## 2016-01-13 DIAGNOSIS — E440 Moderate protein-calorie malnutrition: Secondary | ICD-10-CM | POA: Diagnosis present

## 2016-01-13 DIAGNOSIS — E559 Vitamin D deficiency, unspecified: Secondary | ICD-10-CM | POA: Diagnosis present

## 2016-01-13 DIAGNOSIS — Z7982 Long term (current) use of aspirin: Secondary | ICD-10-CM

## 2016-01-13 DIAGNOSIS — N289 Disorder of kidney and ureter, unspecified: Secondary | ICD-10-CM

## 2016-01-13 LAB — URINALYSIS, ROUTINE W REFLEX MICROSCOPIC
BILIRUBIN URINE: NEGATIVE
GLUCOSE, UA: NEGATIVE mg/dL
Hgb urine dipstick: NEGATIVE
KETONES UR: NEGATIVE mg/dL
NITRITE: NEGATIVE
PH: 7 (ref 5.0–8.0)
PROTEIN: NEGATIVE mg/dL
Specific Gravity, Urine: 1.011 (ref 1.005–1.030)

## 2016-01-13 LAB — CBC WITH DIFFERENTIAL/PLATELET
Basophils Absolute: 0 10*3/uL (ref 0.0–0.1)
Basophils Relative: 0 %
EOS ABS: 0 10*3/uL (ref 0.0–0.7)
EOS PCT: 0 %
HCT: 36.5 % (ref 36.0–46.0)
Hemoglobin: 11.6 g/dL — ABNORMAL LOW (ref 12.0–15.0)
LYMPHS PCT: 19 %
Lymphs Abs: 1.4 10*3/uL (ref 0.7–4.0)
MCH: 30.7 pg (ref 26.0–34.0)
MCHC: 31.8 g/dL (ref 30.0–36.0)
MCV: 96.6 fL (ref 78.0–100.0)
MONO ABS: 0.4 10*3/uL (ref 0.1–1.0)
MONOS PCT: 6 %
Neutro Abs: 5.2 10*3/uL (ref 1.7–7.7)
Neutrophils Relative %: 75 %
PLATELETS: 158 10*3/uL (ref 150–400)
RBC: 3.78 MIL/uL — AB (ref 3.87–5.11)
RDW: 14.5 % (ref 11.5–15.5)
WBC: 7 10*3/uL (ref 4.0–10.5)

## 2016-01-13 LAB — I-STAT TROPONIN, ED: TROPONIN I, POC: 0 ng/mL (ref 0.00–0.08)

## 2016-01-13 LAB — COMPREHENSIVE METABOLIC PANEL
ALT: 10 U/L — ABNORMAL LOW (ref 14–54)
ANION GAP: 10 (ref 5–15)
AST: 16 U/L (ref 15–41)
Albumin: 3.8 g/dL (ref 3.5–5.0)
Alkaline Phosphatase: 72 U/L (ref 38–126)
BUN: 63 mg/dL — ABNORMAL HIGH (ref 6–20)
CALCIUM: 8.9 mg/dL (ref 8.9–10.3)
CHLORIDE: 110 mmol/L (ref 101–111)
CO2: 23 mmol/L (ref 22–32)
CREATININE: 2.38 mg/dL — AB (ref 0.44–1.00)
GFR, EST AFRICAN AMERICAN: 21 mL/min — AB (ref >60)
GFR, EST NON AFRICAN AMERICAN: 18 mL/min — AB (ref >60)
Glucose, Bld: 101 mg/dL — ABNORMAL HIGH (ref 65–99)
Potassium: 6.4 mmol/L (ref 3.5–5.1)
SODIUM: 143 mmol/L (ref 135–145)
Total Bilirubin: 1.5 mg/dL — ABNORMAL HIGH (ref 0.3–1.2)
Total Protein: 6.3 g/dL — ABNORMAL LOW (ref 6.5–8.1)

## 2016-01-13 LAB — URINE MICROSCOPIC-ADD ON

## 2016-01-13 LAB — BASIC METABOLIC PANEL
Anion gap: 8 (ref 5–15)
BUN: 57 mg/dL — ABNORMAL HIGH (ref 6–20)
CALCIUM: 9.1 mg/dL (ref 8.9–10.3)
CO2: 24 mmol/L (ref 22–32)
CREATININE: 2.03 mg/dL — AB (ref 0.44–1.00)
Chloride: 114 mmol/L — ABNORMAL HIGH (ref 101–111)
GFR calc non Af Amer: 22 mL/min — ABNORMAL LOW (ref >60)
GFR, EST AFRICAN AMERICAN: 25 mL/min — AB (ref >60)
Glucose, Bld: 71 mg/dL (ref 65–99)
Potassium: 5.1 mmol/L (ref 3.5–5.1)
SODIUM: 146 mmol/L — AB (ref 135–145)

## 2016-01-13 LAB — POTASSIUM: POTASSIUM: 5.7 mmol/L — AB (ref 3.5–5.1)

## 2016-01-13 LAB — BRAIN NATRIURETIC PEPTIDE: B Natriuretic Peptide: 43.6 pg/mL (ref 0.0–100.0)

## 2016-01-13 LAB — I-STAT CG4 LACTIC ACID, ED: LACTIC ACID, VENOUS: 1.96 mmol/L (ref 0.5–2.0)

## 2016-01-13 MED ORDER — SODIUM CHLORIDE 0.9 % IV BOLUS (SEPSIS)
500.0000 mL | Freq: Once | INTRAVENOUS | Status: AC
  Administered 2016-01-13: 500 mL via INTRAVENOUS

## 2016-01-13 MED ORDER — ASPIRIN EC 81 MG PO TBEC
81.0000 mg | DELAYED_RELEASE_TABLET | Freq: Every day | ORAL | Status: DC
  Administered 2016-01-14 – 2016-01-15 (×2): 81 mg via ORAL
  Filled 2016-01-13 (×3): qty 1

## 2016-01-13 MED ORDER — PANTOPRAZOLE SODIUM 40 MG PO TBEC
40.0000 mg | DELAYED_RELEASE_TABLET | Freq: Every day | ORAL | Status: DC
  Administered 2016-01-13 – 2016-01-15 (×3): 40 mg via ORAL
  Filled 2016-01-13 (×3): qty 1

## 2016-01-13 MED ORDER — HEPARIN SODIUM (PORCINE) 5000 UNIT/ML IJ SOLN
5000.0000 [IU] | Freq: Three times a day (TID) | INTRAMUSCULAR | Status: DC
  Administered 2016-01-13 – 2016-01-14 (×3): 5000 [IU] via SUBCUTANEOUS
  Filled 2016-01-13 (×4): qty 1

## 2016-01-13 MED ORDER — ACETAMINOPHEN 325 MG PO TABS
650.0000 mg | ORAL_TABLET | Freq: Four times a day (QID) | ORAL | Status: DC | PRN
  Administered 2016-01-14: 650 mg via ORAL
  Filled 2016-01-13: qty 2

## 2016-01-13 MED ORDER — LEVOTHYROXINE SODIUM 50 MCG PO TABS
50.0000 ug | ORAL_TABLET | Freq: Every day | ORAL | Status: DC
  Administered 2016-01-14 – 2016-01-15 (×2): 50 ug via ORAL
  Filled 2016-01-13 (×2): qty 1

## 2016-01-13 MED ORDER — MEMANTINE HCL 10 MG PO TABS
10.0000 mg | ORAL_TABLET | Freq: Two times a day (BID) | ORAL | Status: DC
Start: 2016-01-13 — End: 2016-01-15
  Administered 2016-01-13 – 2016-01-15 (×4): 10 mg via ORAL
  Filled 2016-01-13 (×5): qty 1

## 2016-01-13 MED ORDER — SODIUM CHLORIDE 0.9 % IV SOLN
1.0000 g | Freq: Once | INTRAVENOUS | Status: AC
  Administered 2016-01-13: 1 g via INTRAVENOUS
  Filled 2016-01-13: qty 10

## 2016-01-13 MED ORDER — SODIUM CHLORIDE 0.9 % IV SOLN
INTRAVENOUS | Status: AC
  Administered 2016-01-13: 18:00:00 via INTRAVENOUS

## 2016-01-13 MED ORDER — INSULIN ASPART 100 UNIT/ML IV SOLN
10.0000 [IU] | Freq: Once | INTRAVENOUS | Status: AC
  Administered 2016-01-13: 10 [IU] via INTRAVENOUS
  Filled 2016-01-13: qty 1

## 2016-01-13 MED ORDER — ACETAMINOPHEN 650 MG RE SUPP: 650.0000 mg | Freq: Four times a day (QID) | RECTAL | Status: DC | PRN

## 2016-01-13 MED ORDER — SODIUM CHLORIDE 0.9 % IJ SOLN
3.0000 mL | Freq: Two times a day (BID) | INTRAMUSCULAR | Status: DC
  Administered 2016-01-13: 3 mL via INTRAVENOUS

## 2016-01-13 MED ORDER — VITAMIN B-12 1000 MCG PO TABS
1000.0000 ug | ORAL_TABLET | Freq: Every day | ORAL | Status: DC
  Administered 2016-01-13 – 2016-01-15 (×3): 1000 ug via ORAL
  Filled 2016-01-13 (×4): qty 1

## 2016-01-13 MED ORDER — DEXTROSE 50 % IV SOLN
50.0000 mL | Freq: Once | INTRAVENOUS | Status: AC
  Administered 2016-01-13: 50 mL via INTRAVENOUS
  Filled 2016-01-13: qty 50

## 2016-01-13 MED ORDER — VITAMIN D 1000 UNITS PO TABS
1000.0000 [IU] | ORAL_TABLET | Freq: Two times a day (BID) | ORAL | Status: DC
  Administered 2016-01-13 – 2016-01-15 (×4): 1000 [IU] via ORAL
  Filled 2016-01-13 (×5): qty 1

## 2016-01-13 NOTE — ED Notes (Signed)
Critical value, K+ - 6.4, Reported to MD

## 2016-01-13 NOTE — ED Notes (Signed)
Patient to ED with C/O shortness of breath and intermittent diarrhea for 2 weeks. Denies nausea or vomiting.

## 2016-01-13 NOTE — H&P (Signed)
[facility] Admission History and Physical Service Pager: [phone]  Patient name: [name] Medical record number: [id number] Date of birth: Aug 26, 1930 Age: 82 y.o. Gender: female  Primary Care Provider: Fae Pippin Consultants: none Code Status: full  Chief Complaint: diarrhea  Assessment and Plan: [name] is a 82 y.o. female presenting with acute on chronic diarrhea, AKI and hyperkalemia . PMH is significant for chronic diarrhea, dementia, NICM, HFmrEF, AVB-III, hypertension and hypothyroidism.   Diarrhea/Dehydration: likely viral gastroenteritis vs IBS/IBD/malabsorption vs hyperthyroidism (though TSH has been elevated the last 2 checks). C. Diff is a possibility. Low suspicion for inflammatory process without abdominal pain. Chronic baseline diarrhea concerning for malabsorbition or motility issues. History of heart failure and NICM makes ischemic process possible. However, patient without pain. GI malignancy also a possibility but no constitutional symptoms. Diarrhea non-bloody as well. BP within normal limit although this was after IV fluid on ambulance. Afebrile.  CBC without leukocytosis. BMP significant for CR 2.38 (b/l 0.8) and K of 6.4. Patient on K at home. DG chest and abdomen normal. EKG without peaked T waves -Admit to FPTS. Attending Dr. Nori Riis -telemetry -IV fluids. S/p 1L of NS in ED. Continue mIVF. Patient with history of NICM -Enteric precautions -C. Diff -GI path -Stool fat, WBC, O/P, lactoferrin -Daily weight -I&O  AKI: sCr 2.38 b/l 0.8. Likely prenatal from diarrhea -Hold home lasix and lisinopril. -IVF as above -am BMP  Hyperkalemia: K 6.4 in ED. No EKG changes per ED report. (no EKG report in the system yet). S/p calcium gluconate, dextrose and insulin.  -BMP this evening  -am BMP -Hold home potassium  Hypertension: normotensive on arrival to ED. On coreg, lisinopril and lasix at home -hold home  medications for now. -Resume BB in am and ACE/Lasix as able pending improvement in Cr   CAD/NICM/HFmrEF: last Echo in 11/2015 with EF of 40-45%, diffuse hypokinesis and G2DD. Appears dehydrated now. Lung exam with good aeration and CTAB. CXR normal. -Will keep fluid at maintenance level -I&O -Daily weight   AVB (3rd degree): s/p PMP. Last checked in 03/2015 per EMR. Working at that time.  Hypothyroidism: 11/2015 TSH 6.770. On Synthroid 54mcg at home. -continue home synthroid -TSH  Dementia with hallucination: on memantine at home -continue home memantine   FEN/GI: -IVF as above -Heart healthy diet -Protonix  Prophylaxis: heparin subQ  Disposition: Telemetry bed  History of Present Illness:  [name] is a 82 y.o. female presenting with diarrhea.  Patient with history of Lewi body dementia. History was obtained over the phone from her nanny who she lives with.  Patient with chronic diarrhea at baseline that has gotten worse over the last two days. Diarrhea watery in nature. She had multiple accidents at home. Denies blood in stool. Denies nausea, vomiting, fever & cold like symptoms. She had some shortness of breath that has resolved. She denies chest pain.  Also denies sick contact or eating something new. Per family, no recent travel or undercooked foods.  Denies new medication leading to this. Her appetite has been good. Nanny gave her some medicine for diarrhea today but can't tell the name of the medication.   Additionally, patient has never seen a GI for her chronic diarrhea.  Also, patient performs ADLs independently at home.  She uses a walker to ambulate.  Review Of Systems: Per HPI Otherwise the remainder of the systems were negative.  Patient Active Problem List   Diagnosis Date Noted  . Renal  insufficiency 01/13/2016  . Chest pain 11/15/2015  . Acute on chronic combined systolic and diastolic HF (heart failure) ([place]) 11/15/2015  . NSTEMI (non-ST elevated  myocardial infarction) ([place]) 11/13/2015  . Dyspnea on exertion 08/05/2013  . Cardiomyopathy, nonischemic ([place]) 08/05/2013  . Pacemaker-[employer] 11/08/2011  . Hypertension 11/08/2011  . GASTRIC ULCER 10/04/2009  . GERD 08/22/2009  . CONSTIPATION 08/22/2009  . FECAL OCCULT BLOOD 08/22/2009    Past Medical History: Past Medical History  Diagnosis Date  . GERD   . GASTRIC ULCER   . CONSTIPATION   . Breast cancer ([place])   . Osteoporosis   . HTN (hypertension)   . Hypothyroidism   . Vitamin D deficiency   . Arthritis   . Complete heart block (HCC)     S/P pacemaker  . Cardiomyopathy     Past Surgical History: Past Surgical History  Procedure Laterality Date  . Cholecystectomy    . Doppler echocardiography  2007    Social History: Social History  Substance Use Topics  . Smoking status: Never Smoker   . Smokeless tobacco: Never Used  . Alcohol Use: No   Additional social history:  Please also refer to relevant sections of EMR.  Family History: Family History  Problem Relation Age of Onset  . Breast cancer Mother     Allergies and Medications: Allergies  Allergen Reactions  . Pramipexole Dihydrochloride Other (See Comments)    Hallucinations from generic mirapex  . Sulfonamide Derivatives Rash   No current facility-administered medications on file prior to encounter.   Current Outpatient Prescriptions on File Prior to Encounter  Medication Sig Dispense Refill  . aspirin EC 81 MG tablet Take 81 mg by mouth daily.    . carvedilol (COREG) 6.25 MG tablet TAKE 1 TABLET BY MOUTH TWICE A DAY WITH A MEAL 60 tablet 5  . cholecalciferol (VITAMIN D) 1000 UNITS tablet Take 1,000 Units by mouth 2 (two) times daily.     . furosemide (LASIX) 20 MG tablet Take 1 tablet (20 mg total) by mouth daily. 30 tablet 11  . levothyroxine (SYNTHROID, LEVOTHROID) 50 MCG tablet Take 50 mcg by mouth daily.    Marland Kitchen lisinopril (PRINIVIL,ZESTRIL) 10 MG tablet Take 1 tablet (10 mg total) by mouth  daily. 30 tablet 9  . memantine (NAMENDA) 10 MG tablet Take 10 mg by mouth 2 (two) times daily.    . pantoprazole (PROTONIX) 40 MG tablet Take 40 mg by mouth daily.    . potassium chloride SA (K-DUR,KLOR-CON) 20 MEQ tablet Take 1 tablet (20 mEq total) by mouth daily. 30 tablet 11  . vitamin B-12 (CYANOCOBALAMIN) 1000 MCG tablet Take 1,000 mcg by mouth daily.      Objective: BP 174/87 mmHg  Pulse 59  Resp 16  SpO2 100% Exam: Gen: appears frail, awake, alert, hard of hearing Nares: clear, no erythema, swelling or congestion Oropharynx: clear, mm dry over her lips CV: regular rate and rythm. S1 barely audible. Cap refill about 3 sec. Radial and DP pulse 2+ Resp: no apparent work of breathing, clear to auscultation bilaterally. GI: bowel sounds normal, no tenderness to palpation, no rebound or guarding, no mass.  GU: no suprapubic tenderness, no CVA Skin: no lesion MSK: no edema Neuro: follows commands, hard of hearing Psych: dementia at baseline  Labs and Imaging: CBC BMET   Recent Labs Lab 01/13/16 1441  WBC 7.0  HGB 11.6*  HCT 36.5  PLT 158    Recent Labs Lab 01/13/16 1441  NA 143  K 6.4*  CL 110  CO2 23  BUN 63*  CREATININE 2.38*  GLUCOSE 101*  CALCIUM 8.9    Dg Abd Acute W/chest  01/13/2016  CLINICAL DATA:  Lower abdominal pain and diarrhea today. EXAM: DG ABDOMEN ACUTE W/ 1V CHEST COMPARISON:  Chest x-ray 11/13/2015, 10/02/2011 and pelvis/ lumbar spine 01/19/2015. FINDINGS: Left-sided pacemaker unchanged. Lungs are adequately inflated without consolidation or effusion. Cardiomediastinal silhouette and remainder of the chest is unchanged. Abdominal images demonstrate a nonobstructive bowel gas pattern. No free peritoneal air. A few air-fluid levels noted on the decubitus film. Surgical clips over the right upper quadrant. Numerous small calcified granulomas over the liver and spleen. Left hip arthroplasty unchanged. Stable degenerative changes of the spine and right  hip. IMPRESSION: Nonobstructive bowel gas pattern.  No acute cardiopulmonary disease. Electronically Signed   By: Marin Olp M.D.   On: 01/13/2016 15:49    Mercy Riding, MD 01/13/2016, 4:58 PM PGY-1, [facility] pager: [phone], text pages welcome  I have separately seen and examined the patient. I have discussed the findings and exam with Dr Cyndia Skeeters and agree with the above note.  My changes/additions are outlined in BLUE.   Ashly M. Lajuana Ripple, DO PGY-2, [place]

## 2016-01-13 NOTE — ED Provider Notes (Signed)
CSN: [id number]     Arrival date & time 01/13/16  1301 History   First MD Initiated Contact with Patient 01/13/16 1310     Chief Complaint  Patient presents with  . Shortness of Breath     (Consider location/radiation/quality/duration/timing/severity/associated sxs/prior Treatment) Patient is a 82 y.o. female presenting with shortness of breath.  Shortness of Breath Associated symptoms: abdominal pain   Associated symptoms: no chest pain, no cough, no fever, no headaches, no neck pain, no rash and no vomiting    Patient presents with multiple episodes of diarrhea starting last night. She states the diarrhea is uncontrollable and she was unable to get up out of her bed. Worsened this morning. Admits to lightheadedness and generalized weakness. She also states that she had pain under her right rib cage with the diarrhea that is now resolved. She denies having any shortness of breath though that was the initial complaint per EMS. She's had no chest pain. No nausea or vomiting. Denies any fever or chills. No gross blood in the stool. Past Medical History  Diagnosis Date  . GERD   . GASTRIC ULCER   . CONSTIPATION   . Breast cancer ([place])   . Osteoporosis   . HTN (hypertension)   . Hypothyroidism   . Vitamin D deficiency   . Arthritis   . Complete heart block (HCC)     S/P pacemaker  . Cardiomyopathy    Past Surgical History  Procedure Laterality Date  . Cholecystectomy    . Doppler echocardiography  2007   Family History  Problem Relation Age of Onset  . Breast cancer Mother    Social History  Substance Use Topics  . Smoking status: Never Smoker   . Smokeless tobacco: Never Used  . Alcohol Use: No   OB History    No data available     Review of Systems  Constitutional: Positive for fatigue. Negative for fever and chills.  Respiratory: Positive for shortness of breath. Negative for cough.   Cardiovascular: Negative for chest pain, palpitations and leg swelling.   Gastrointestinal: Positive for abdominal pain and diarrhea. Negative for nausea, vomiting, constipation and blood in stool.  Genitourinary: Negative for dysuria, frequency and flank pain.  Musculoskeletal: Negative for back pain, neck pain and neck stiffness.  Skin: Negative for rash and wound.  Neurological: Positive for dizziness, weakness (generalized) and light-headedness. Negative for syncope, numbness and headaches.  All other systems reviewed and are negative.     Allergies  Pramipexole dihydrochloride and Sulfonamide derivatives  Home Medications   Prior to Admission medications   Medication Sig Start Date End Date Taking? Authorizing Provider  aspirin EC 81 MG tablet Take 81 mg by mouth daily.    Historical Provider, MD  carvedilol (COREG) 6.25 MG tablet TAKE 1 TABLET BY MOUTH TWICE A DAY WITH A MEAL 11/08/15   Evans Lance, MD  cholecalciferol (VITAMIN D) 1000 UNITS tablet Take 1,000 Units by mouth 2 (two) times daily.     Historical Provider, MD  furosemide (LASIX) 20 MG tablet Take 1 tablet (20 mg total) by mouth daily. 11/16/15   Brett Canales, PA-C  levothyroxine (SYNTHROID, LEVOTHROID) 50 MCG tablet Take 50 mcg by mouth daily. 10/20/15   Historical Provider, MD  lisinopril (PRINIVIL,ZESTRIL) 10 MG tablet Take 1 tablet (10 mg total) by mouth daily. 07/07/15   Evans Lance, MD  memantine (NAMENDA) 10 MG tablet Take 10 mg by mouth 2 (two) times daily. 10/20/15   Historical  Provider, MD  pantoprazole (PROTONIX) 40 MG tablet Take 40 mg by mouth daily.    Historical Provider, MD  potassium chloride SA (K-DUR,KLOR-CON) 20 MEQ tablet Take 1 tablet (20 mEq total) by mouth daily. 11/16/15   Brett Canales, PA-C  vitamin B-12 (CYANOCOBALAMIN) 1000 MCG tablet Take 1,000 mcg by mouth daily.    Historical Provider, MD   BP 174/87 mmHg  Pulse 59  Resp 16  SpO2 100% Physical Exam  Constitutional: She is oriented to person, place, and time. She appears well-developed and  well-nourished. No distress.  Frail appearing  HENT:  Head: Normocephalic and atraumatic.  Mouth/Throat: Oropharynx is clear and moist.  Eyes: EOM are normal. Pupils are equal, round, and reactive to light.  Neck: Normal range of motion. Neck supple.  Cardiovascular: Normal rate and regular rhythm.  Exam reveals no gallop and no friction rub.   No murmur heard. Pulmonary/Chest: Effort normal and breath sounds normal. No respiratory distress. She has no wheezes. She has no rales. She exhibits no tenderness.  Abdominal: Soft. Bowel sounds are normal. She exhibits no distension and no mass. There is no tenderness. There is no rebound and no guarding.  Musculoskeletal: Normal range of motion. She exhibits no edema or tenderness.  No lower extremity swelling. Distal pulses intact and equal.  Neurological: She is alert and oriented to person, place, and time.  4/5 motor in all extremities. Sensation is grossly intact.  Skin: Skin is warm and dry. No rash noted. No erythema.  Psychiatric: She has a normal mood and affect. Her behavior is normal.  Nursing note and vitals reviewed.   ED Course  Procedures (including critical care time) Labs Review Labs Reviewed  CBC WITH DIFFERENTIAL/PLATELET - Abnormal; Notable for the following:    RBC 3.78 (*)    Hemoglobin 11.6 (*)    All other components within normal limits  COMPREHENSIVE METABOLIC PANEL - Abnormal; Notable for the following:    Potassium 6.4 (*)    Glucose, Bld 101 (*)    BUN 63 (*)    Creatinine, Ser 2.38 (*)    Total Protein 6.3 (*)    ALT 10 (*)    Total Bilirubin 1.5 (*)    GFR calc non Af Amer 18 (*)    GFR calc Af Amer 21 (*)    All other components within normal limits  URINALYSIS, ROUTINE W REFLEX MICROSCOPIC (NOT AT [facility]) - Abnormal; Notable for the following:    Color, Urine AMBER (*)    APPearance CLOUDY (*)    Leukocytes, UA MODERATE (*)    All other components within normal limits  URINE MICROSCOPIC-ADD ON -  Abnormal; Notable for the following:    Squamous Epithelial / LPF 0-5 (*)    Bacteria, UA MANY (*)    Casts HYALINE CASTS (*)    All other components within normal limits  C DIFFICILE QUICK SCREEN W PCR REFLEX  BRAIN NATRIURETIC PEPTIDE  POTASSIUM  I-STAT CG4 LACTIC ACID, ED  I-STAT TROPOININ, ED    Imaging Review Dg Abd Acute W/chest  01/13/2016  CLINICAL DATA:  Lower abdominal pain and diarrhea today. EXAM: DG ABDOMEN ACUTE W/ 1V CHEST COMPARISON:  Chest x-ray 11/13/2015, 10/02/2011 and pelvis/ lumbar spine 01/19/2015. FINDINGS: Left-sided pacemaker unchanged. Lungs are adequately inflated without consolidation or effusion. Cardiomediastinal silhouette and remainder of the chest is unchanged. Abdominal images demonstrate a nonobstructive bowel gas pattern. No free peritoneal air. A few air-fluid levels noted on the decubitus film. Surgical  clips over the right upper quadrant. Numerous small calcified granulomas over the liver and spleen. Left hip arthroplasty unchanged. Stable degenerative changes of the spine and right hip. IMPRESSION: Nonobstructive bowel gas pattern.  No acute cardiopulmonary disease. Electronically Signed   By: Marin Olp M.D.   On: 01/13/2016 15:49   I have personally reviewed and evaluated these images and lab results as part of my medical decision-making.   EKG Interpretation None      MDM   Final diagnoses:  Diarrhea, unspecified type  Renal insufficiency  Hyperkalemia   Patient with hyperkalemia and acute renal failure. Likely due to dehydration. Given calcium gluconate, insulin and glucose in the emergency department as well as IV fluids. Discussed with family medicine resident and will admit under Dr. Nori Riis to telemetry bed.    Julianne Rice, MD 01/13/16 [phone]

## 2016-01-13 NOTE — ED Notes (Signed)
Patient C/O diarrhea that began last night. States that it was uncontrollable.   Patient states that she had pain in her right lower rib cage. States that it is pretty much gone now.  Patient was incontinent of stool on arrival. States that she is not feeling better but is not feeling worse.  She states that she was recently hospitalized and was told that she has dementia.

## 2016-01-13 NOTE — Progress Notes (Signed)
Called ED nurse for report. 

## 2016-01-13 NOTE — ED Notes (Signed)
[name], daughter leaves contact number [phone].

## 2016-01-13 NOTE — ED Notes (Signed)
Pt escorted to floor at this time, no belongings left in room.

## 2016-01-14 ENCOUNTER — Encounter (HOSPITAL_COMMUNITY): Payer: Self-pay | Admitting: Behavioral Health

## 2016-01-14 DIAGNOSIS — R197 Diarrhea, unspecified: Secondary | ICD-10-CM | POA: Insufficient documentation

## 2016-01-14 DIAGNOSIS — E875 Hyperkalemia: Secondary | ICD-10-CM | POA: Insufficient documentation

## 2016-01-14 DIAGNOSIS — R630 Anorexia: Secondary | ICD-10-CM | POA: Insufficient documentation

## 2016-01-14 DIAGNOSIS — R634 Abnormal weight loss: Secondary | ICD-10-CM | POA: Insufficient documentation

## 2016-01-14 DIAGNOSIS — F0390 Unspecified dementia without behavioral disturbance: Secondary | ICD-10-CM | POA: Insufficient documentation

## 2016-01-14 LAB — CBC
HEMATOCRIT: 33.8 % — AB (ref 36.0–46.0)
HEMOGLOBIN: 10.9 g/dL — AB (ref 12.0–15.0)
MCH: 31.3 pg (ref 26.0–34.0)
MCHC: 32.2 g/dL (ref 30.0–36.0)
MCV: 97.1 fL (ref 78.0–100.0)
Platelets: 159 10*3/uL (ref 150–400)
RBC: 3.48 MIL/uL — AB (ref 3.87–5.11)
RDW: 14.5 % (ref 11.5–15.5)
WBC: 4.8 10*3/uL (ref 4.0–10.5)

## 2016-01-14 LAB — COMPREHENSIVE METABOLIC PANEL
ALBUMIN: 3.4 g/dL — AB (ref 3.5–5.0)
ALK PHOS: 64 U/L (ref 38–126)
ALT: 9 U/L — ABNORMAL LOW (ref 14–54)
ANION GAP: 10 (ref 5–15)
AST: 14 U/L — ABNORMAL LOW (ref 15–41)
BUN: 45 mg/dL — ABNORMAL HIGH (ref 6–20)
CALCIUM: 9 mg/dL (ref 8.9–10.3)
CO2: 21 mmol/L — AB (ref 22–32)
Chloride: 115 mmol/L — ABNORMAL HIGH (ref 101–111)
Creatinine, Ser: 1.55 mg/dL — ABNORMAL HIGH (ref 0.44–1.00)
GFR calc non Af Amer: 30 mL/min — ABNORMAL LOW (ref >60)
GFR, EST AFRICAN AMERICAN: 35 mL/min — AB (ref >60)
Glucose, Bld: 103 mg/dL — ABNORMAL HIGH (ref 65–99)
POTASSIUM: 5 mmol/L (ref 3.5–5.1)
SODIUM: 146 mmol/L — AB (ref 135–145)
Total Bilirubin: 1.2 mg/dL (ref 0.3–1.2)
Total Protein: 5.8 g/dL — ABNORMAL LOW (ref 6.5–8.1)

## 2016-01-14 LAB — TSH: TSH: 6.277 u[IU]/mL — ABNORMAL HIGH (ref 0.350–4.500)

## 2016-01-14 MED ORDER — CARVEDILOL 6.25 MG PO TABS
6.2500 mg | ORAL_TABLET | Freq: Two times a day (BID) | ORAL | Status: DC
  Administered 2016-01-14 – 2016-01-15 (×3): 6.25 mg via ORAL
  Filled 2016-01-14 (×3): qty 1

## 2016-01-14 MED ORDER — SODIUM CHLORIDE 0.9 % IV SOLN
INTRAVENOUS | Status: AC
  Administered 2016-01-14: 10:00:00 via INTRAVENOUS

## 2016-01-14 MED ORDER — ENSURE ENLIVE PO LIQD
237.0000 mL | Freq: Three times a day (TID) | ORAL | Status: DC
  Administered 2016-01-14 (×2): 237 mL via ORAL

## 2016-01-14 NOTE — Progress Notes (Signed)
New Admission Note  Arrival: via stretcher Mental Orientation: Alert and oriented X 3. Has Dementia. Telemetry: Assessment: On tele Skin: No Skin issue verified by 2nd RN Becca. [id number] AC Pain: No C/o of pain Safety measures:  verbalized understanding. Bed in lowest position. Non-skid socks on. Bed alarm on. Family: No family at bedside. Orders have been reviewed and implemented. Will continue to monitor.

## 2016-01-14 NOTE — Progress Notes (Signed)
Family Medicine Teaching Service Daily Progress Note Intern Pager: [phone]  Patient name: [name] Medical record number: [id number] Date of birth: 1930/09/29 Age: 82 y.o. Gender: female  Primary Care Provider: Fae Pippin Consultants: none Code Status: full  Pt Overview and Major Events to Date:  1/13: Admitted to inpatient FPTS   Assessment and Plan: [name] is a 82 y.o. female presenting with acute on chronic diarrhea, AKI and hyperkalemia. PMH is significant for chronic diarrhea, dementia, NICM, HFmrEF, AVB-III, hypertension and hypothyroidism.   Diarrhea/Dehydration: likely viral gastroenteritis vs IBS/IBD/malabsorption vs hyperthyroidism (though TSH has been elevated the last 2 checks). DG chest and abdomen normal. Pt has been afebrile.Normal lactic acid. CBC without leukocytosis.  Has had no bowel movements since admission. Still appears dehydrated on exam with dry mucous membranes and skin tenting.  -telemetry -Daily weight -I&O -s/p mIVF for 12 hours yesterday. Patient with history of NICM. Will resume mIVF today for 12 hours as pt appears dehydrated still -Enteric precautions -C. Diff pending, GI path pending, Stool fat, WBC, O/P, lactoferrin pending; pt has not had a bowel movement yet so labs have not been drawn.   AKI: Resolving.  Cr improved this AM to 1.55  -Continue to hold home lasix and lisinopril for now. - mIVF as above  Hyperkalemia: Resolved. 5.0 today. EKG in ED not found but apparently it was normal -Will resume home potassium when restarting Lasix - EKG today to ensure no acute changes  Weight Loss/frail appearing: Pt appears very frail on exam and was not eating. She weighed 111 lbs in March 2016, 100 lbs in November 2016, and 91 lbs today. Spoke with patient's daughter Phil Dopp) on the phone who stated that she noticed that her mother's weight was down but that she typically eats breakfast, lunch, and dinner.  -  Nutrition consult - PT/OT eval  Hypertension: normotensive on arrival to ED, now hypertensive with systolic BP in Q000111Q. On coreg, lisinopril and lasix at home -Continue to hold home ACE/Lasix until Cr improves  Anemia: Hgb dropped from 11.6 to 10.9 overnight. May be from hemodilution from IVF.  - CBC in AM  CAD/NICM/HFmrEF: last Echo in 11/2015 with EF of 40-45%, diffuse hypokinesis and G2DD. Still appears dehydrated on exam. Lung exam with good aeration and CTAB. CXR normal. 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 Last checked in 03/2015 per EMR. Working at that time.  Hypothyroidism: 11/2015 TSH 6.770. TSH on 1/13: 6.27. On Synthroid 61mcg at home. -continue home synthroid  Dementia with hallucination: on memantine at home -continue home memantine   FEN/GI: -mIVF for 12 hours -Heart healthy diet -Protonix  Disposition: Home pending improvement of dehydration  Subjective:  - Pt upset that her daughters are not at bedside - She repeatedly states she is dying and wants to go home to die - Denies chest pain, abdominal pain, shortness of breath, nausea, vomiting, or diarrhea.   Objective: Temp:  [97.6 F (36.4 C)-98.5 F (36.9 C)] 98.5 F (36.9 C) (01/14 0502) Pulse Rate:  [59-65] 65 (01/14 0502) Resp:  [11-19] 17 (01/14 0502) BP: (103-174)/(46-87) 153/77 mmHg (01/14 0502) SpO2:  [97 %-100 %] 97 % (01/14 0502) Weight:  [91 lb 8 oz (41.504 kg)] 91 lb 8 oz (41.504 kg) (01/13 2026) Physical Exam: General: In NAD, appears frail, awake, alert, hard of hearing HEENT: dry mucous membranes Cardiovascular: regular rate and rythm. S1 barely audible. Cap refill about 2  sec. Radial and DP pulse 2+ Respiratory: No increased work of breathing, clear to auscultation bilaterally Abdomen: bowel sounds normal, no tenderness to palpation, no rebound or guarding, no masses palpated.  Extremities: no edema Skin: some skin tenting present, no  rashes Neuro: Alert and oriented; able to state full name, date of birth, time and place. Baseline dementia present.  Laboratory:  Recent Labs Lab 01/13/16 1441 01/14/16 0511  WBC 7.0 4.8  HGB 11.6* 10.9*  HCT 36.5 33.8*  PLT 158 159    Recent Labs Lab 01/13/16 1441 01/13/16 1741 01/13/16 2006 01/14/16 0511  NA 143  --  146* 146*  K 6.4* 5.7* 5.1 5.0  CL 110  --  114* 115*  CO2 23  --  24 21*  BUN 63*  --  57* 45*  CREATININE 2.38*  --  2.03* 1.55*  CALCIUM 8.9  --  9.1 9.0  PROT 6.3*  --   --  5.8*  BILITOT 1.5*  --   --  1.2  ALKPHOS 72  --   --  64  ALT 10*  --   --  9*  AST 16  --   --  14*  GLUCOSE 101*  --  71 103*    Imaging/Diagnostic Tests: Dg Abd Acute W/chest  01/13/2016  CLINICAL DATA:  Lower abdominal pain and diarrhea today. EXAM: DG ABDOMEN ACUTE W/ 1V CHEST COMPARISON:  Chest x-ray 11/13/2015, 10/02/2011 and pelvis/ lumbar spine 01/19/2015. FINDINGS: Left-sided pacemaker unchanged. Lungs are adequately inflated without consolidation or effusion. Cardiomediastinal silhouette and remainder of the chest is unchanged. Abdominal images demonstrate a nonobstructive bowel gas pattern. No free peritoneal air. A few air-fluid levels noted on the decubitus film. Surgical clips over the right upper quadrant. Numerous small calcified granulomas over the liver and spleen. Left hip arthroplasty unchanged. Stable degenerative changes of the spine and right hip. IMPRESSION: Nonobstructive bowel gas pattern.  No acute cardiopulmonary disease. Electronically Signed   By: Marin Olp M.D.   On: 01/13/2016 15:49     Carlyle Dolly, MD 01/14/2016, 5:57 AM PGY-1, [place] Intern pager: [phone], text pages welcome

## 2016-01-14 NOTE — Progress Notes (Signed)
Initial Nutrition Assessment  DOCUMENTATION CODES:   Non-severe (moderate) malnutrition in context of chronic illness, Underweight  INTERVENTION:  Provide Ensure Enlive po TID, each supplement provides 350 kcal and 20 grams of protein.  Encourage adequate PO intake.  NUTRITION DIAGNOSIS:   Malnutrition related to chronic illness as evidenced by percent weight loss, moderate depletions of muscle mass.  GOAL:   Patient will meet greater than or equal to 90% of their needs  MONITOR:   PO intake, Supplement acceptance, Weight trends, Labs, I & O's  REASON FOR ASSESSMENT:   Consult Assessment of nutrition requirement/status  ASSESSMENT:   82 y.o. female presenting with acute on chronic diarrhea, AKI and hyperkalemia . PMH is significant for chronic diarrhea, dementia, NICM, HFmrEF, AVB-III, hypertension and hypothyroidism.   Meal completion 0% this AM. Pt reports having a decreased appetite over the past couple of days. Pt reports however she has been eating well PTA with consumption of at least 3 meals a day. Per Epic weight records, pt with a 9% weight loss in 2 months. Pt is agreeable to Ensure to aid in caloric and protein needs. Pt was encouraged to eat her food at meals.   Nutrition-Focused physical exam completed. Findings are no fat depletion, moderate muscle depletion, and no edema.   Labs: Low CO2, AST, ALT, and GFR. High sodium, chloride, BUN, and creatinine.   Diet Order:  Diet Heart Room service appropriate?: Yes; Fluid consistency:: Thin  Skin:  Reviewed, no issues  Last BM:  1/13  Height:   Ht Readings from Last 1 Encounters:  01/13/16 5' (1.524 m)    Weight:   Wt Readings from Last 1 Encounters:  01/14/16 91 lb 8 oz (41.504 kg)    Ideal Body Weight:  45.45 kg  BMI:  Body mass index is 17.87 kg/(m^2).  Estimated Nutritional Needs:   Kcal:  1350-1550  Protein:  60-75 grams  Fluid:  >/= 1.5 L/day  EDUCATION NEEDS:   No education needs  identified at this time  Corrin Parker, MS, RD, LDN Pager # [phone] After hours/ weekend pager # [phone]

## 2016-01-15 DIAGNOSIS — R634 Abnormal weight loss: Secondary | ICD-10-CM

## 2016-01-15 DIAGNOSIS — N179 Acute kidney failure, unspecified: Principal | ICD-10-CM

## 2016-01-15 DIAGNOSIS — R197 Diarrhea, unspecified: Secondary | ICD-10-CM

## 2016-01-15 DIAGNOSIS — R630 Anorexia: Secondary | ICD-10-CM

## 2016-01-15 DIAGNOSIS — N289 Disorder of kidney and ureter, unspecified: Secondary | ICD-10-CM

## 2016-01-15 DIAGNOSIS — E875 Hyperkalemia: Secondary | ICD-10-CM

## 2016-01-15 DIAGNOSIS — F0390 Unspecified dementia without behavioral disturbance: Secondary | ICD-10-CM

## 2016-01-15 LAB — CBC
HCT: 32.4 % — ABNORMAL LOW (ref 36.0–46.0)
HEMOGLOBIN: 10.8 g/dL — AB (ref 12.0–15.0)
MCH: 31.9 pg (ref 26.0–34.0)
MCHC: 33.3 g/dL (ref 30.0–36.0)
MCV: 95.6 fL (ref 78.0–100.0)
PLATELETS: 133 10*3/uL — AB (ref 150–400)
RBC: 3.39 MIL/uL — AB (ref 3.87–5.11)
RDW: 14.3 % (ref 11.5–15.5)
WBC: 4.1 10*3/uL (ref 4.0–10.5)

## 2016-01-15 LAB — BASIC METABOLIC PANEL
Anion gap: 6 (ref 5–15)
BUN: 26 mg/dL — ABNORMAL HIGH (ref 6–20)
CHLORIDE: 116 mmol/L — AB (ref 101–111)
CO2: 22 mmol/L (ref 22–32)
CREATININE: 1.21 mg/dL — AB (ref 0.44–1.00)
Calcium: 8.7 mg/dL — ABNORMAL LOW (ref 8.9–10.3)
GFR calc non Af Amer: 40 mL/min — ABNORMAL LOW (ref >60)
GFR, EST AFRICAN AMERICAN: 47 mL/min — AB (ref >60)
GLUCOSE: 99 mg/dL (ref 65–99)
Potassium: 4.3 mmol/L (ref 3.5–5.1)
Sodium: 144 mmol/L (ref 135–145)

## 2016-01-15 NOTE — Progress Notes (Signed)
Family Medicine Teaching Service Daily Progress Note Intern Pager: [phone]  Patient name: [name] Medical record number: [id number] Date of birth: 04/01/1930 Age: 82 y.o. Gender: female  Primary Care Provider: Fae Pippin Consultants: none Code Status: full  Pt Overview and Major Events to Date:  1/13: Admitted to inpatient FPTS  1/14: AKI resolving, no BMs, nutrition and PT/OT consulted  Assessment and Plan: [name] is a 82 y.o. female presenting with acute on chronic diarrhea, AKI and hyperkalemia. PMH is significant for chronic diarrhea, dementia, NICM, HFmrEF, AVB-III, hypertension and hypothyroidism.   Diarrhea/Dehydration: likely viral gastroenteritis. DG chest and abdomen normal. Pt has been afebrile. CBC without leukocytosis. Has had no bowel movements since admission. Appears hydrated on exam today -Daily weight -I&O -No more fluids today as AKI resolving and patient appears more hydrated -Stool labs and enteric precautions cancelled as pt has not had a bowel movement since admission  AKI: Resolving.  Cr improved this AM to 1.21 -Continue to hold home lasix and lisinopril for now. - Encourage PO intake   Hyperkalemia: Resolved. EKG showed RBBB without peaked t waves yesterday, has pacemaker. -Will resume home potassium when restarting Lasix - DC telemetry today   Moderate Malnutrition/frail appearing: Pt weighed 111 lbs in March 2016, 100 lbs in November 2016, and 91 lbs today. Nutrition consult, recommended Ensure Enlive TID. Pt is taking good PO on exam. Pt admits to using cane at home but states she does not have the cane with her.  - Nutrition already consulted, appreciate their assistance - PT/OT eval, appreciate their assistance - Encourage PO intake  Hypertension: normotensive on arrival to ED, now hypertensive with systolic BP in Q000111Q. On coreg, lisinopril and lasix at home -Continue to hold home ACE/Lasix until Cr  improves  Anemia: Hgb 10.9, stable.   CAD/NICM/HFmrEF: last Echo in 11/2015 with EF of 40-45%, diffuse hypokinesis and G2DD. Still appears dehydrated on exam. Lung exam with good aeration and CTAB. CXR normal. 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 Last checked in 03/2015 per EMR. Working at that time.  Hypothyroidism: 11/2015 TSH 6.770. TSH on 1/13: 6.27. On Synthroid 40mcg at home. -continue home synthroid  Dementia with hallucination: on memantine at home. Had episode of confusion and agitation overnight (see nurses note from this morning) - continue home memantine  - sitter in room  FEN/GI: -SLIV -Heart healthy diet -Protonix  Disposition: Home pending PT/OT evaluation  Subjective:  - Per nursing, patient was very agitated and disoriented overnight. Was rooming the halls and trying to hit staff - This morning, patient is back to baseline and has a sitter in the room - She is eating breakfast and has no complaints - Pt emphasizes that she really wants to go home today  Objective: Temp:  [98 F (36.7 C)-98.5 F (36.9 C)] 98 F (36.7 C) (01/14 2200) Pulse Rate:  [63-65] 65 (01/14 2200) Resp:  [17-20] 20 (01/14 2200) BP: (153-156)/(77-95) 156/85 mmHg (01/14 2200) SpO2:  [97 %-98 %] 98 % (01/14 2200) Weight:  [90 lb 6.2 oz (41 kg)-91 lb 8 oz (41.504 kg)] 90 lb 6.2 oz (41 kg) (01/14 2200) Physical Exam: General: In NAD, appears frail, awake, alert, hard of hearing. Sitting on the side of the bed eating breakfast HEENT: moist mucous membranes Cardiovascular: regular rate and rythm. S1 barely audible. Cap refill about 1 sec. Radial and DP pulse 2+ Respiratory: No increased work of breathing, clear to  auscultation bilaterally Abdomen: bowel sounds normal, no tenderness to palpation, no rebound or guarding, no masses palpated.  Extremities: no edema Skin: good skin turgor Neuro: Alert and oriented; able to state full name, date of birth,  time and place. Baseline dementia present.  Laboratory:  Recent Labs Lab 01/13/16 1441 01/14/16 0511 01/15/16 0405  WBC 7.0 4.8 4.1  HGB 11.6* 10.9* 10.8*  HCT 36.5 33.8* 32.4*  PLT 158 159 133*    Recent Labs Lab 01/13/16 1441  01/13/16 2006 01/14/16 0511 01/15/16 0405  NA 143  --  146* 146* 144  K 6.4*  < > 5.1 5.0 4.3  CL 110  --  114* 115* 116*  CO2 23  --  24 21* 22  BUN 63*  --  57* 45* 26*  CREATININE 2.38*  --  2.03* 1.55* 1.21*  CALCIUM 8.9  --  9.1 9.0 8.7*  PROT 6.3*  --   --  5.8*  --   BILITOT 1.5*  --   --  1.2  --   ALKPHOS 72  --   --  64  --   ALT 10*  --   --  9*  --   AST 16  --   --  14*  --   GLUCOSE 101*  --  71 103* 99  < > = values in this interval not displayed.  Imaging/Diagnostic Tests: No results found.   Carlyle Dolly, MD 01/15/2016, 4:53 AM PGY-1, [place] Intern pager: [phone], text pages welcome

## 2016-01-15 NOTE — Evaluation (Addendum)
Physical Therapy Evaluation Patient Details Name: [name] MRN: [id number] DOB: 10-Apr-1930 Today's Date: 01/15/2016   History of Present Illness  [name] is a 82 y.o. female presenting with acute on chronic diarrhea, AKI and hyperkalemia. PMH is significant for chronic diarrhea, dementia, NICM, HFmrEF, AVB-III, hypertension and hypothyroidism.   Admitted with viral gastroenteritis.    Clinical Impression  Pt admitted with above diagnosis. Pt currently with functional limitations due to the deficits listed below (see PT Problem List). Pt ambulated in hallway with min guard asssit overall except with challenges needed min assist. Spoke at length with daughter [name] on the phone who states that pt will stay with [name] her sister and that she will not have 24 hour care as [name] has children.  This PT explained that pt is unsafe to be alone and that she really needed 24 hour care.  Recommendation is for memory unit at SNF and daughter updated with the recommendation via phone. Daughter states that they would prefer a facility in [place] where her mother in law is if it comes to that.   If the family decides to take her home, CM can arranged HHPT and HHOT as possibly they can instruct her to use a RW.   Pt will benefit from skilled PT to increase their independence and safety with mobility to allow discharge to the venue listed below.      Follow Up Recommendations SNF;Supervision/Assistance - 24 hour, if family decides to take pt home HHPT and Falls.      Equipment Recommendations  None recommended by PT    Recommendations for Other Services       Precautions / Restrictions Precautions Precautions: Fall Restrictions Weight Bearing Restrictions: No      Mobility  Bed Mobility Overal bed mobility: Independent                Transfers Overall transfer level: Needs assistance   Transfers: Sit to/from Stand Sit to Stand: Supervision         General transfer  comment: needs supervision for safety due to poor safety awareness due to pt disorientation.  Has a sitter in room .  Ambulation/Gait Ambulation/Gait assistance: Min guard/min assist with challenges Ambulation Distance (Feet): 350 Feet Assistive device: None Gait Pattern/deviations: Step-through pattern;Decreased stride length;Staggering left;Staggering right;Drifts right/left   Gait velocity interpretation: <1.8 ft/sec, indicative of risk for recurrent falls General Gait Details: Pt ambulates in hall with fair stability.  Pt cannot withstand mod challenges to balance.  Ambulates well in controlled environment but home will not be controlled.  Pt as risk for falls due to this as well as due to confusion and hallucinations.   Stairs Stairs: Yes Stairs assistance: Min guard Stair Management: One rail Right;Alternating pattern;Forwards Number of Stairs: 4 General stair comments: Pt was able to go up and down stairs without LOB with rail with good stability.   Pt did take her glasses off coming down as she said it helped her see better.  Wheelchair Mobility    Modified Rankin (Stroke Patients Only)       Balance Overall balance assessment: Needs assistance;History of Falls Sitting-balance support: No upper extremity supported;Feet supported Sitting balance-Leahy Scale: Normal     Standing balance support: No upper extremity supported;During functional activity Standing balance-Leahy Scale: Fair Standing balance comment: can stand statically and maintain balance.  Cannot tolerate mod challenges to balance without UE support.              High  level balance activites: Direction changes;Turns;Sudden stops;Head turns High Level Balance Comments: min guard assist with challenges.  Standardized Balance Assessment Standardized Balance Assessment : Dynamic Gait Index   Dynamic Gait Index Level Surface: Normal Change in Gait Speed: Normal Gait with Horizontal Head Turns: Mild  Impairment Gait with Vertical Head Turns: Mild Impairment Gait and Pivot Turn: Mild Impairment Step Over Obstacle: Severe Impairment Step Around Obstacles: Moderate Impairment Steps: Mild Impairment Total Score: 15       Pertinent Vitals/Pain Pain Assessment: No/denies pain  VSS    Home Living Family/patient expects to be discharged to:: Private residence Living Arrangements: Children Available Help at Discharge: Family;Available PRN/intermittently Type of Home: House Home Access: Stairs to enter Entrance Stairs-Rails:  (unsure) Entrance Stairs-Number of Steps: 4 Home Layout: One level Home Equipment: Cane - single point;Shower seat Additional Comments: Called daughter [name] who lives across street from pt.  Daughter states that [name], a friend was caregiver to pt, but [name] is on O2 with health issues and has kids and cannot care for pt anymore.  Daughter states that plan is for pt to go to her sister [name]'s home.  [name] may not be there 24 hours a day because she has kids.      Prior Function Level of Independence: Independent               Hand Dominance        Extremity/Trunk Assessment   Upper Extremity Assessment: Defer to OT evaluation           Lower Extremity Assessment: Generalized weakness      Cervical / Trunk Assessment: Normal  Communication   Communication: No difficulties  Cognition Arousal/Alertness: Awake/alert Behavior During Therapy: Anxious;Restless;Impulsive Overall Cognitive Status: Impaired/Different from baseline Area of Impairment: Orientation;Following commands;Safety/judgement;Awareness;Problem solving Orientation Level: Disoriented to;Situation;Time;Person     Following Commands: Follows one step commands with increased time;Follows one step commands inconsistently Safety/Judgement: Decreased awareness of safety;Decreased awareness of deficits Awareness:  (pre intellectual) Problem Solving: Slow processing;Decreased  initiation;Difficulty sequencing;Requires verbal cues;Requires tactile cues General Comments: Pt very confused.  Hallucinating.  Pt unaware of surroundings and unaware of plans even after PT and NT just discussed them with her.  Pt with poor problem solving ability.  Lacks insight into issues.      General Comments General comments (skin integrity, edema, etc.): Pt scored 15/24 on DGI suggesting at risk for falls without device.  Due to confusion, even with cane she is a fall risk.  Do not feel that introducing RW would help either as do not feel that pt can handle a new device without repetition.      Exercises        Assessment/Plan    PT Assessment Patient needs continued PT services  PT Diagnosis Generalized weakness   PT Problem List Decreased activity tolerance;Decreased balance;Decreased mobility;Decreased knowledge of use of DME;Decreased safety awareness;Decreased knowledge of precautions;Decreased cognition;Decreased coordination  PT Treatment Interventions DME instruction;Gait training;Functional mobility training;Therapeutic activities;Therapeutic exercise;Balance training;Patient/family education;Cognitive remediation   PT Goals (Current goals can be found in the Care Plan section) Acute Rehab PT Goals Patient Stated Goal: to get better PT Goal Formulation: With patient Time For Goal Achievement: 01/22/16 Potential to Achieve Goals: Good    Frequency Min 2X/week   Barriers to discharge Decreased caregiver support (daughter cannot provide 24 hour care)      Co-evaluation               End of Session Equipment Utilized During Treatment:  Gait belt Activity Tolerance: Patient limited by fatigue (limited by confusion) Patient left: in bed;with call bell/phone within reach;with nursing/sitter in room Nurse Communication: Mobility status         Time: [id number] PT Time Calculation (min) (ACUTE ONLY): 31 min   Charges:   PT Evaluation $PT Eval Moderate  Complexity: 1 Procedure PT Treatments $Gait Training: 8-22 mins   PT G CodesIrwin Brakeman F 02/13/16, 11:08 AM Benna Arno,PT Acute Rehabilitation [phone] (pager)

## 2016-01-15 NOTE — Progress Notes (Signed)
During the night, the patient became increasingly more confused.  She thought she was in [place] and vaious other cities. She thought people were out to kill or shoot her.  She refused to stay in the bed.  She was ambulated in the hall multiple times.  She stumbled several times.  She began hitting at the staff.  She was brought to the nursing desk to sit.  She got out of chair and proceeding to knock the call bell system off the desk.  She was again ambulated in the hall and brought back to the desk to sit.  She is still refusing to go back to her room.  Will continue to monitor patient for safety. Safety sitter was requested.   [employer] RN-BC, WTA.

## 2016-01-15 NOTE — Care Management Note (Signed)
Case Management Note  Patient Details  Name: CASH MEADOW MRN: [id number] Date of Birth: 08/24/1930  Subjective/Objective:        Patient presented with acute on chronic diarrhea, AKI and hyperkalemia.             Action/Plan: CM met with patient and daughter at bedside regarding recommendations for HHPT/OT services, patient and family are agreeable. Offered choice of [employer], selected [facility]. Referral was called in to [facility] for [place], verified contact information with patient and daughter. 24-48 hrs patient will be contacted to set up initial visit. Teach back done patient  verbalized understanding. No further CM needs identified  Expected Discharge Date:      01/15/16            Expected Discharge Plan:  [place]  In-House Referral:     Discharge planning Services  CM Consult  Post Acute Care Choice:  Home Health Choice offered to:  Patient, Adult Children  DME Arranged:    DME Agency:     HH Arranged:  PT, OT HH Agency:  [place]  Status of Service:  Completed, signed off  Medicare Important Message Given:  Yes Date Medicare IM Given:    Medicare IM give by:    Date Additional Medicare IM Given:    Additional Medicare Important Message give by:     If discussed at [place] of Stay Meetings, dates discussed:    Additional CommentsLaurena Slimmer, RN 01/15/2016, 1:15 PM

## 2016-01-15 NOTE — Discharge Summary (Signed)
[facility] Discharge Summary  Patient name: [name] Medical record number: [id number] Date of birth: Apr 11, 1930 Age: 82 y.o. Gender: female Date of Admission: 01/13/2016  Date of Discharge: 01/15/16 Admitting Physician: Dickie La, MD  Primary Care Provider: Fae Pippin Consultants: none  Indication for Hospitalization: AKI  Discharge Diagnoses/Problem List:  Patient Active Problem List   Diagnosis Date Noted  . Diarrhea   . Hyperkalemia   . Dementia   . Loss of weight   . Poor appetite   . Renal insufficiency 01/13/2016  . AKI (acute kidney injury) ([place]) 01/13/2016  . Chest pain 11/15/2015  . Acute on chronic combined systolic and diastolic HF (heart failure) ([place]) 11/15/2015  . NSTEMI (non-ST elevated myocardial infarction) ([place]) 11/13/2015  . Dyspnea on exertion 08/05/2013  . Cardiomyopathy, nonischemic ([place]) 08/05/2013  . Pacemaker-St.Jude 11/08/2011  . Hypertension 11/08/2011  . GASTRIC ULCER 10/04/2009  . GERD 08/22/2009  . CONSTIPATION 08/22/2009  . FECAL OCCULT BLOOD 08/22/2009   Disposition: Home   Discharge Condition: stable  Discharge Exam: see previous progress note  Brief Hospital Course:  [name] is a 82 y.o. female who presented with acute on chronic diarrhea, AKI and hyperkalemia. PMH is significant for chronic diarrhea, dementia, NICM, HFmrEF, AVB-III, hypertension and hypothyroidism.   AKI likely from diarrhea  Patient presented with complaints of acute on chronic diarrhea. XRAY chest and abdomen normal. Pt remained afebrile. CBC without leukocytosis. On admission appeared dehydrated on physical exam. Initial Creatinine was 2.38, home lasix and lisinopril were held. She was given maintenance IVF. Had no bowel movements during hospitalization, hence GI path panel and C. Diff testing was not done. By discharge, AKI was mostly resolved and Creatinine was 1.21.  Hyperkalemia:  K 6.4 in ED. Received  calcium gluconate, dextrose and insulin. EKG showed RBBB without peaked t waves, has pacemaker. Lasix and home potassium were held. On discharge, K had normalized and home Lasix and potassium were restarted.   Moderate Malnutrition/frail appearing:  Pt weighed 111 lbs in March 2016, 100 lbs in November 2016, and 91 lbs on admission. Nutrition consulted, recommended Ensure Enlive TID. PT/OT evaluated patient. She was sent home with home health PT.  Dementia with hallucination: Patient became more confused at night. On night of 1/14 patient became very confused and refused to stay in her room and began hitting at the staff. Safety sitter was requested. During the daytime patient's mental status improved. Home memantine was continued.  All other chronic medical conditions stable throughout admission and managed with home regimens.   Issues for Follow Up:  1. Pt has had decreased appetite and recent weight loss. Consider starting Remeron as an outpatient to help increase her appetite.  Significant Procedures: none  Significant Labs and Imaging:   Recent Labs Lab 01/13/16 1441 01/14/16 0511 01/15/16 0405  WBC 7.0 4.8 4.1  HGB 11.6* 10.9* 10.8*  HCT 36.5 33.8* 32.4*  PLT 158 159 133*    Recent Labs Lab 01/13/16 1441  01/13/16 2006 01/14/16 0511 01/15/16 0405  NA 143  --  146* 146* 144  K 6.4*  < > 5.1 5.0 4.3  CL 110  --  114* 115* 116*  CO2 23  --  24 21* 22  GLUCOSE 101*  --  71 103* 99  BUN 63*  --  57* 45* 26*  CREATININE 2.38*  --  2.03* 1.55* 1.21*  CALCIUM 8.9  --  9.1 9.0 8.7*  ALKPHOS 72  --   --  64  --   AST 16  --   --  14*  --   ALT 10*  --   --  9*  --   ALBUMIN 3.8  --   --  3.4*  --   < > = values in this interval not displayed.    Results/Tests Pending at Time of Discharge: none  Discharge Medications:    Medication List    TAKE these medications        aspirin EC 81 MG tablet  Take 81 mg by mouth daily.     carvedilol 6.25 MG tablet  Commonly  known as:  COREG  TAKE 1 TABLET BY MOUTH TWICE A DAY WITH A MEAL     cholecalciferol 1000 units tablet  Commonly known as:  VITAMIN D  Take 1,000 Units by mouth 2 (two) times daily.     furosemide 20 MG tablet  Commonly known as:  LASIX  Take 1 tablet (20 mg total) by mouth daily.     levothyroxine 50 MCG tablet  Commonly known as:  SYNTHROID, LEVOTHROID  Take 50 mcg by mouth daily.     lisinopril 10 MG tablet  Commonly known as:  PRINIVIL,ZESTRIL  Take 1 tablet (10 mg total) by mouth daily.     memantine 10 MG tablet  Commonly known as:  NAMENDA  Take 10 mg by mouth 2 (two) times daily.     pantoprazole 40 MG tablet  Commonly known as:  PROTONIX  Take 40 mg by mouth daily.     potassium chloride SA 20 MEQ tablet  Commonly known as:  K-DUR,KLOR-CON  Take 1 tablet (20 mEq total) by mouth daily.     vitamin B-12 1000 MCG tablet  Commonly known as:  CYANOCOBALAMIN  Take 1,000 mcg by mouth daily.        Discharge Instructions: Please refer to Patient Instructions section of EMR for full details.  Patient was counseled important signs and symptoms that should prompt return to medical care, changes in medications, dietary instructions, activity restrictions, and follow up appointments.   Follow-Up Appointments:     Follow-up Information    Follow up with CONROY,NATHAN, PA-C.   Specialty:  Physician Assistant   Why:  Please call ASAP for hospital follow-up appointment.   Contact information:   [address] [place] [id number] [place], MD 01/15/2016, 12:58 PM PGY-1, [place]

## 2016-01-15 NOTE — Discharge Instructions (Signed)
You were hospitalized because you were having diarrhea that caused you to become dehydrated. The dehydration caused some kidney injury. We gave you fluids and monitored your kidney function. You have not had any diarrhea since being hospitalized. Your kidney injury has gotten better. We had physical therapy evaluate you, and they thought you would benefit from some home health physical therapy and occupational therapy. We have ordered these services for you.   It is very important to continue to drink as much as possible to keep yourself hydrated. You should also try drinking Ensure at home to give you protein, fat, and calories.

## 2016-01-15 NOTE — Care Management Important Message (Signed)
Important Message  Patient Details  Name: [name] MRN: [id number] Date of Birth: [birth year]   Medicare Important Message Given:  Ernesta Amble, RN 01/15/2016, 12:41 PM

## 2016-01-25 DIAGNOSIS — G3183 Dementia with Lewy bodies: Secondary | ICD-10-CM | POA: Diagnosis not present

## 2016-01-25 DIAGNOSIS — F0281 Dementia in other diseases classified elsewhere with behavioral disturbance: Secondary | ICD-10-CM | POA: Diagnosis not present

## 2016-01-25 DIAGNOSIS — I5043 Acute on chronic combined systolic (congestive) and diastolic (congestive) heart failure: Secondary | ICD-10-CM | POA: Diagnosis not present

## 2016-01-25 DIAGNOSIS — I2510 Atherosclerotic heart disease of native coronary artery without angina pectoris: Secondary | ICD-10-CM | POA: Diagnosis not present

## 2016-01-25 DIAGNOSIS — I428 Other cardiomyopathies: Secondary | ICD-10-CM | POA: Diagnosis not present

## 2016-01-25 DIAGNOSIS — I10 Essential (primary) hypertension: Secondary | ICD-10-CM | POA: Diagnosis not present

## 2016-01-30 DIAGNOSIS — I10 Essential (primary) hypertension: Secondary | ICD-10-CM | POA: Diagnosis not present

## 2016-01-30 DIAGNOSIS — I428 Other cardiomyopathies: Secondary | ICD-10-CM | POA: Diagnosis not present

## 2016-01-30 DIAGNOSIS — G3183 Dementia with Lewy bodies: Secondary | ICD-10-CM | POA: Diagnosis not present

## 2016-01-30 DIAGNOSIS — F0281 Dementia in other diseases classified elsewhere with behavioral disturbance: Secondary | ICD-10-CM | POA: Diagnosis not present

## 2016-01-30 DIAGNOSIS — I5043 Acute on chronic combined systolic (congestive) and diastolic (congestive) heart failure: Secondary | ICD-10-CM | POA: Diagnosis not present

## 2016-01-30 DIAGNOSIS — I2510 Atherosclerotic heart disease of native coronary artery without angina pectoris: Secondary | ICD-10-CM | POA: Diagnosis not present

## 2016-02-01 DIAGNOSIS — N289 Disorder of kidney and ureter, unspecified: Secondary | ICD-10-CM | POA: Diagnosis not present

## 2016-02-01 DIAGNOSIS — Z6820 Body mass index (BMI) 20.0-20.9, adult: Secondary | ICD-10-CM | POA: Diagnosis not present

## 2016-02-01 DIAGNOSIS — Z1389 Encounter for screening for other disorder: Secondary | ICD-10-CM | POA: Diagnosis not present

## 2016-02-01 DIAGNOSIS — F0390 Unspecified dementia without behavioral disturbance: Secondary | ICD-10-CM | POA: Diagnosis not present

## 2016-02-01 DIAGNOSIS — R197 Diarrhea, unspecified: Secondary | ICD-10-CM | POA: Diagnosis not present

## 2016-02-01 DIAGNOSIS — I5042 Chronic combined systolic (congestive) and diastolic (congestive) heart failure: Secondary | ICD-10-CM | POA: Diagnosis not present

## 2016-02-01 DIAGNOSIS — E039 Hypothyroidism, unspecified: Secondary | ICD-10-CM | POA: Diagnosis not present

## 2016-03-02 ENCOUNTER — Emergency Department (HOSPITAL_COMMUNITY): Payer: Medicare Other

## 2016-03-02 ENCOUNTER — Encounter (HOSPITAL_COMMUNITY): Payer: Self-pay

## 2016-03-02 ENCOUNTER — Inpatient Hospital Stay (HOSPITAL_COMMUNITY)
Admission: EM | Admit: 2016-03-02 | Discharge: 2016-03-06 | DRG: 683 | Disposition: A | Discharge status: Skilled Nursing Facility | Payer: Medicare Other | Attending: Internal Medicine | Admitting: Internal Medicine

## 2016-03-02 DIAGNOSIS — R0600 Dyspnea, unspecified: Secondary | ICD-10-CM | POA: Diagnosis present

## 2016-03-02 DIAGNOSIS — Z853 Personal history of malignant neoplasm of breast: Secondary | ICD-10-CM

## 2016-03-02 DIAGNOSIS — I10 Essential (primary) hypertension: Secondary | ICD-10-CM | POA: Diagnosis present

## 2016-03-02 DIAGNOSIS — I5043 Acute on chronic combined systolic (congestive) and diastolic (congestive) heart failure: Secondary | ICD-10-CM | POA: Diagnosis not present

## 2016-03-02 DIAGNOSIS — E559 Vitamin D deficiency, unspecified: Secondary | ICD-10-CM | POA: Diagnosis present

## 2016-03-02 DIAGNOSIS — M810 Age-related osteoporosis without current pathological fracture: Secondary | ICD-10-CM | POA: Diagnosis present

## 2016-03-02 DIAGNOSIS — N189 Chronic kidney disease, unspecified: Secondary | ICD-10-CM

## 2016-03-02 DIAGNOSIS — N183 Chronic kidney disease, stage 3 (moderate): Secondary | ICD-10-CM | POA: Diagnosis present

## 2016-03-02 DIAGNOSIS — R4182 Altered mental status, unspecified: Secondary | ICD-10-CM | POA: Diagnosis not present

## 2016-03-02 DIAGNOSIS — R0602 Shortness of breath: Secondary | ICD-10-CM | POA: Diagnosis not present

## 2016-03-02 DIAGNOSIS — Z7982 Long term (current) use of aspirin: Secondary | ICD-10-CM

## 2016-03-02 DIAGNOSIS — I252 Old myocardial infarction: Secondary | ICD-10-CM

## 2016-03-02 DIAGNOSIS — M1990 Unspecified osteoarthritis, unspecified site: Secondary | ICD-10-CM | POA: Diagnosis present

## 2016-03-02 DIAGNOSIS — R627 Adult failure to thrive: Secondary | ICD-10-CM | POA: Diagnosis present

## 2016-03-02 DIAGNOSIS — I130 Hypertensive heart and chronic kidney disease with heart failure and stage 1 through stage 4 chronic kidney disease, or unspecified chronic kidney disease: Secondary | ICD-10-CM | POA: Diagnosis present

## 2016-03-02 DIAGNOSIS — I5042 Chronic combined systolic (congestive) and diastolic (congestive) heart failure: Secondary | ICD-10-CM | POA: Diagnosis present

## 2016-03-02 DIAGNOSIS — R5383 Other fatigue: Secondary | ICD-10-CM | POA: Diagnosis present

## 2016-03-02 DIAGNOSIS — E039 Hypothyroidism, unspecified: Secondary | ICD-10-CM | POA: Diagnosis present

## 2016-03-02 DIAGNOSIS — K219 Gastro-esophageal reflux disease without esophagitis: Secondary | ICD-10-CM | POA: Diagnosis present

## 2016-03-02 DIAGNOSIS — R197 Diarrhea, unspecified: Secondary | ICD-10-CM | POA: Diagnosis present

## 2016-03-02 DIAGNOSIS — E870 Hyperosmolality and hypernatremia: Secondary | ICD-10-CM | POA: Diagnosis present

## 2016-03-02 DIAGNOSIS — D649 Anemia, unspecified: Secondary | ICD-10-CM | POA: Diagnosis present

## 2016-03-02 DIAGNOSIS — N179 Acute kidney failure, unspecified: Principal | ICD-10-CM | POA: Diagnosis present

## 2016-03-02 DIAGNOSIS — Z950 Presence of cardiac pacemaker: Secondary | ICD-10-CM

## 2016-03-02 DIAGNOSIS — I159 Secondary hypertension, unspecified: Secondary | ICD-10-CM

## 2016-03-02 DIAGNOSIS — I4510 Unspecified right bundle-branch block: Secondary | ICD-10-CM | POA: Diagnosis present

## 2016-03-02 DIAGNOSIS — K529 Noninfective gastroenteritis and colitis, unspecified: Secondary | ICD-10-CM | POA: Diagnosis present

## 2016-03-02 DIAGNOSIS — D696 Thrombocytopenia, unspecified: Secondary | ICD-10-CM | POA: Diagnosis present

## 2016-03-02 DIAGNOSIS — I429 Cardiomyopathy, unspecified: Secondary | ICD-10-CM | POA: Diagnosis present

## 2016-03-02 DIAGNOSIS — F0390 Unspecified dementia without behavioral disturbance: Secondary | ICD-10-CM | POA: Diagnosis present

## 2016-03-02 LAB — CBC WITH DIFFERENTIAL/PLATELET
BASOS ABS: 0 10*3/uL (ref 0.0–0.1)
BASOS PCT: 0 %
EOS ABS: 0 10*3/uL (ref 0.0–0.7)
Eosinophils Relative: 0 %
HEMATOCRIT: 34.4 % — AB (ref 36.0–46.0)
HEMOGLOBIN: 10.9 g/dL — AB (ref 12.0–15.0)
Lymphocytes Relative: 18 %
Lymphs Abs: 1.2 10*3/uL (ref 0.7–4.0)
MCH: 30.2 pg (ref 26.0–34.0)
MCHC: 31.7 g/dL (ref 30.0–36.0)
MCV: 95.3 fL (ref 78.0–100.0)
Monocytes Absolute: 0.4 10*3/uL (ref 0.1–1.0)
Monocytes Relative: 6 %
NEUTROS ABS: 5.1 10*3/uL (ref 1.7–7.7)
NEUTROS PCT: 76 %
Platelets: 141 10*3/uL — ABNORMAL LOW (ref 150–400)
RBC: 3.61 MIL/uL — ABNORMAL LOW (ref 3.87–5.11)
RDW: 13.3 % (ref 11.5–15.5)
WBC: 6.8 10*3/uL (ref 4.0–10.5)

## 2016-03-02 LAB — COMPREHENSIVE METABOLIC PANEL
ALBUMIN: 3.9 g/dL (ref 3.5–5.0)
ALK PHOS: 63 U/L (ref 38–126)
ALT: 12 U/L — ABNORMAL LOW (ref 14–54)
AST: 18 U/L (ref 15–41)
Anion gap: 14 (ref 5–15)
BILIRUBIN TOTAL: 1.4 mg/dL — AB (ref 0.3–1.2)
BUN: 36 mg/dL — AB (ref 6–20)
CHLORIDE: 105 mmol/L (ref 101–111)
CO2: 24 mmol/L (ref 22–32)
CREATININE: 1.77 mg/dL — AB (ref 0.44–1.00)
Calcium: 9.3 mg/dL (ref 8.9–10.3)
GFR calc Af Amer: 29 mL/min — ABNORMAL LOW (ref >60)
GFR calc non Af Amer: 25 mL/min — ABNORMAL LOW (ref >60)
Glucose, Bld: 131 mg/dL — ABNORMAL HIGH (ref 65–99)
Potassium: 4.3 mmol/L (ref 3.5–5.1)
SODIUM: 143 mmol/L (ref 135–145)
Total Protein: 6.6 g/dL (ref 6.5–8.1)

## 2016-03-02 LAB — TROPONIN I: TROPONIN I: 0.03 ng/mL (ref <0.031)

## 2016-03-02 LAB — I-STAT TROPONIN, ED: Troponin i, poc: 0 ng/mL (ref 0.00–0.08)

## 2016-03-02 LAB — BRAIN NATRIURETIC PEPTIDE: B NATRIURETIC PEPTIDE 5: 96.1 pg/mL (ref 0.0–100.0)

## 2016-03-02 MED ORDER — ENOXAPARIN SODIUM 30 MG/0.3ML ~~LOC~~ SOLN
30.0000 mg | SUBCUTANEOUS | Status: DC
Start: 2016-03-02 — End: 2016-03-06
  Administered 2016-03-03 – 2016-03-05 (×4): 30 mg via SUBCUTANEOUS
  Filled 2016-03-02 (×4): qty 0.3

## 2016-03-02 MED ORDER — MORPHINE SULFATE (PF) 2 MG/ML IV SOLN: 2.0000 mg | INTRAVENOUS | Status: DC | PRN

## 2016-03-02 MED ORDER — LISINOPRIL 10 MG PO TABS: 10.0000 mg | ORAL_TABLET | Freq: Every day | ORAL | Status: DC

## 2016-03-02 MED ORDER — SODIUM CHLORIDE 0.9 % IV SOLN
INTRAVENOUS | Status: DC
  Administered 2016-03-03: via INTRAVENOUS

## 2016-03-02 MED ORDER — LEVOTHYROXINE SODIUM 125 MCG PO TABS
62.5000 ug | ORAL_TABLET | Freq: Every day | ORAL | Status: DC
  Administered 2016-03-04 – 2016-03-06 (×3): 62.5 ug via ORAL
  Filled 2016-03-02 (×5): qty 0.5

## 2016-03-02 MED ORDER — SODIUM CHLORIDE 0.9 % IV BOLUS (SEPSIS)
500.0000 mL | Freq: Once | INTRAVENOUS | Status: AC
  Administered 2016-03-02: 500 mL via INTRAVENOUS

## 2016-03-02 MED ORDER — ACETAMINOPHEN 325 MG PO TABS
650.0000 mg | ORAL_TABLET | ORAL | Status: DC | PRN
  Administered 2016-03-04 – 2016-03-05 (×2): 650 mg via ORAL
  Filled 2016-03-02 (×2): qty 2

## 2016-03-02 MED ORDER — MEMANTINE HCL 10 MG PO TABS
10.0000 mg | ORAL_TABLET | Freq: Two times a day (BID) | ORAL | Status: DC
  Administered 2016-03-03 – 2016-03-06 (×7): 10 mg via ORAL
  Filled 2016-03-02 (×10): qty 1

## 2016-03-02 MED ORDER — GI COCKTAIL ~~LOC~~
30.0000 mL | Freq: Four times a day (QID) | ORAL | Status: DC | PRN
  Filled 2016-03-02: qty 30

## 2016-03-02 MED ORDER — VITAMIN D 1000 UNITS PO TABS
1000.0000 [IU] | ORAL_TABLET | Freq: Two times a day (BID) | ORAL | Status: DC
Start: 2016-03-02 — End: 2016-03-06
  Administered 2016-03-03 – 2016-03-06 (×7): 1000 [IU] via ORAL
  Filled 2016-03-02 (×9): qty 1

## 2016-03-02 MED ORDER — PANTOPRAZOLE SODIUM 40 MG PO TBEC
40.0000 mg | DELAYED_RELEASE_TABLET | Freq: Every day | ORAL | Status: DC
  Administered 2016-03-03 – 2016-03-06 (×4): 40 mg via ORAL
  Filled 2016-03-02 (×4): qty 1

## 2016-03-02 MED ORDER — VITAMIN B-12 1000 MCG PO TABS
500.0000 ug | ORAL_TABLET | Freq: Every day | ORAL | Status: DC
  Administered 2016-03-03 – 2016-03-06 (×4): 500 ug via ORAL
  Filled 2016-03-02 (×8): qty 1

## 2016-03-02 MED ORDER — CARVEDILOL 6.25 MG PO TABS
6.2500 mg | ORAL_TABLET | Freq: Two times a day (BID) | ORAL | Status: DC
  Administered 2016-03-03 – 2016-03-06 (×8): 6.25 mg via ORAL
  Filled 2016-03-02 (×8): qty 1

## 2016-03-02 MED ORDER — POTASSIUM CHLORIDE CRYS ER 20 MEQ PO TBCR: 20.0000 meq | EXTENDED_RELEASE_TABLET | Freq: Every day | ORAL | Status: DC

## 2016-03-02 MED ORDER — ONDANSETRON HCL 4 MG/2ML IJ SOLN: 4.0000 mg | Freq: Four times a day (QID) | INTRAMUSCULAR | Status: DC | PRN

## 2016-03-02 MED ORDER — ASPIRIN EC 81 MG PO TBEC
81.0000 mg | DELAYED_RELEASE_TABLET | Freq: Every day | ORAL | Status: DC
  Administered 2016-03-03 – 2016-03-06 (×4): 81 mg via ORAL
  Filled 2016-03-02 (×4): qty 1

## 2016-03-02 NOTE — ED Notes (Signed)
Patient transported to X-ray 

## 2016-03-02 NOTE — H&P (Addendum)
[facility] History and Physical  [name] [id number] DOB: [birth year] DOA: 03/02/2016  Referring physician: ED PCP: Fae Pippin   Chief Complaint: Fatigue and shortness of breath  HPI:  [name] is a 82 year old female with a past medical history significant for systolic and diastolic congestive heart failure, and NSTEMI in November 2016, dementia, hypothyroidism, anemia, hypertension; who presents with vague symptoms of fatigue and shortness of breath. Patient is somewhat of a poor historian, but appears at least able to give some. Also called and talked with daughter, [name] who provided similar history. She complained of some shortness of breath, diarrhea, and feelings of dizziness for which EMS was initially called.The patient had been having symptoms of diarrhea for the last 2 days and woke up this morning not feeling well. Patient reports having approximately 2 episodes of diarrhea. Daughter is unsure of any recent antibiotics being given in the last 30 days. Patient denies having any recent sick contacts Patient lives with a caregiver, but notes that they have to leave an order to care for things such as picking up their children and she feels alone. She asked if we are here to hurt her and that she feels nervous and anxious. Denies having any chest pain, nausea, vomiting, abdominal pain, or shortness of breath currently. She reports appetite is unchanged and reports taking all of her medications as advised including Lasix. Patient was evaluated in the emergency department and seen have elevated creatinine function of 1.77 baseline was previously 1.2 at her last discharge. Patient's BNP and troponin levels were within normal limits.  All other labs appeared to be similar to the previous.    Review of Systems  Constitutional: Positive for malaise/fatigue. Negative for fever and chills.  HENT: Negative for ear pain and tinnitus.   Respiratory: Positive for  shortness of breath. Negative for cough and wheezing.   Cardiovascular: Negative for chest pain and leg swelling.  Gastrointestinal: Positive for diarrhea. Negative for nausea and abdominal pain.  Genitourinary: Negative for urgency and frequency.  Musculoskeletal: Negative for joint pain and falls.  Skin: Negative for itching and rash.  Neurological: Negative for sensory change and speech change.  Psychiatric/Behavioral: Positive for memory loss. Negative for suicidal ideas. The patient is nervous/anxious.         Past Medical History  Diagnosis Date  . GERD   . GASTRIC ULCER   . CONSTIPATION   . Breast cancer ([place])   . Osteoporosis   . HTN (hypertension)   . Hypothyroidism   . Vitamin D deficiency   . Arthritis   . Complete heart block ([facility])     S/P pacemaker  . Cardiomyopathy      Past Surgical History  Procedure Laterality Date  . Cholecystectomy    . Doppler echocardiography  2007      Social History:  reports that she has never smoked. She has never used smokeless tobacco. She reports that she does not drink alcohol or use illicit drugs.  Where does patient live--home  and with whom if at home? Caregiver Can patient participate in ADLs? yes  Allergies  Allergen Reactions  . Pramipexole Dihydrochloride Other (See Comments)    Hallucinations from generic mirapex  . Sulfonamide Derivatives Rash    Family History  Problem Relation Age of Onset  . Breast cancer Mother       Prior to Admission medications   Medication Sig Start Date End Date Taking? Authorizing Provider  aspirin EC 81 MG tablet Take  81 mg by mouth daily.   Yes Historical Provider, MD  carvedilol (COREG) 6.25 MG tablet TAKE 1 TABLET BY MOUTH TWICE A DAY WITH A MEAL 11/08/15  Yes Evans Lance, MD  cholecalciferol (VITAMIN D) 1000 UNITS tablet Take 1,000 Units by mouth 2 (two) times daily.    Yes Historical Provider, MD  furosemide (LASIX) 20 MG tablet Take 1 tablet (20 mg total) by mouth  daily. 11/16/15  Yes Brett Canales, PA-C  levothyroxine (SYNTHROID, LEVOTHROID) 125 MCG tablet Take 62.5 mcg by mouth daily before breakfast. 02/15/16  Yes Historical Provider, MD  lisinopril (PRINIVIL,ZESTRIL) 10 MG tablet Take 1 tablet (10 mg total) by mouth daily. 07/07/15  Yes Evans Lance, MD  memantine (NAMENDA) 10 MG tablet Take 10 mg by mouth 2 (two) times daily. 10/20/15  Yes Historical Provider, MD  pantoprazole (PROTONIX) 40 MG tablet Take 40 mg by mouth daily.   Yes Historical Provider, MD  potassium chloride SA (K-DUR,KLOR-CON) 20 MEQ tablet Take 1 tablet (20 mEq total) by mouth daily. 11/16/15  Yes Brett Canales, PA-C  vitamin B-12 (CYANOCOBALAMIN) 1000 MCG tablet Take 500 mcg by mouth daily.    Yes Historical Provider, MD     Physical Exam: Filed Vitals:   03/02/16 2000 03/02/16 2030 03/02/16 2100 03/02/16 2147  BP: 135/61 122/61 93/68 122/62  Pulse: 59 69 58   Temp:    98 F (36.7 C)  TempSrc:      Resp: 17 18 13    Height:      Weight:      SpO2: 100% 100% 97%      Constitutional: Vital signs reviewed. Patient is a elderly female who appears to beanxious and somewhat  Confused, but still alert and oriented 3. Head: Normocephalic and atraumatic  Ear: TM normal bilaterally  Mouth: no erythema or exudates, dry mucous membranes Eyes: PERRL, EOMI, conjunctivae normal, No scleral icterus.  Neck: Supple, Trachea midline normal ROM, No JVD, mass, thyromegaly, or carotid bruit present.  Cardiovascular: RRR, S1 normal, S2 normal, no MRG, pulses symmetric and intact bilaterally  Pulmonary/Chest: CTAB, no wheezes, rales, or rhonchi  Abdominal: Soft. Non-tender, non-distended, bowel sounds are normal, no masses, organomegaly, or guarding present.  GU: no CVA tenderness Musculoskeletal: No joint deformities, erythema, or stiffness, ROM full and no nontender Ext: no edema and no cyanosis, pulses palpable bilaterally (DP and PT)  Hematology: no cervical, inginal, or axillary  adenopathy.  Neurological: A&O x3, Strenght is normal and symmetric bilaterally, cranial nerve II-XII are grossly intact, no focal motor deficit, sensory intact to light touch bilaterally.  Skin: Warm, dry and intact. No rash, cyanosis, or clubbing. skin tenting  Psychiatric: Anxious mood and affect. speech is normal. Judgment and thought content normal. Cognition and memory are abnormal. Patient is unable to state with family members has difficulty remembering daughter's phone number.    Data Review   Micro Results No results found for this or any previous visit (from the past 240 hour(s)).  Radiology Reports Dg Chest 2 View  03/02/2016  CLINICAL DATA:  Shortness of breath for 1 day. Cardiomyopathy. Complete heart block. Personal history of breast carcinoma. EXAM: CHEST  2 VIEW COMPARISON:  01/13/2016 FINDINGS: The heart size and mediastinal contours are within normal limits. Dual lead transvenous pacemaker remains in appropriate position. Both lungs are clear. No evidence of pneumothorax or pleural effusion. Surgical clips noted in right axilla. IMPRESSION: No active cardiopulmonary disease. Electronically Signed   By: Sharrie Rothman.D.  On: 03/02/2016 19:33     CBC  Recent Labs Lab 03/02/16 1857  WBC 6.8  HGB 10.9*  HCT 34.4*  PLT 141*  MCV 95.3  MCH 30.2  MCHC 31.7  RDW 13.3  LYMPHSABS 1.2  MONOABS 0.4  EOSABS 0.0  BASOSABS 0.0    Chemistries   Recent Labs Lab 03/02/16 1857  NA 143  K 4.3  CL 105  CO2 24  GLUCOSE 131*  BUN 36*  CREATININE 1.77*  CALCIUM 9.3  AST 18  ALT 12*  ALKPHOS 63  BILITOT 1.4*   ------------------------------------------------------------------------------------------------------------------ estimated creatinine clearance is 17.3 mL/min (by C-G formula based on Cr of 1.77). ------------------------------------------------------------------------------------------------------------------ No results for input(s): HGBA1C in the last 72  hours. ------------------------------------------------------------------------------------------------------------------ No results for input(s): CHOL, HDL, LDLCALC, TRIG, CHOLHDL, LDLDIRECT in the last 72 hours. ------------------------------------------------------------------------------------------------------------------ No results for input(s): TSH, T4TOTAL, T3FREE, THYROIDAB in the last 72 hours.  Invalid input(s): FREET3 ------------------------------------------------------------------------------------------------------------------ No results for input(s): VITAMINB12, FOLATE, FERRITIN, TIBC, IRON, RETICCTPCT in the last 72 hours.  Coagulation profile No results for input(s): INR, PROTIME in the last 168 hours.  No results for input(s): DDIMER in the last 72 hours.  Cardiac Enzymes No results for input(s): CKMB, TROPONINI, MYOGLOBIN in the last 168 hours.  Invalid input(s): CK ------------------------------------------------------------------------------------------------------------------ Invalid input(s): POCBNP   CBG: No results for input(s): GLUCAP in the last 168 hours.     EKG: Independently reviewed. Atrial paced rhythm   Assessment/Plan Dyspnea: Acute. Patient with acute symptoms of shortness of breath now resolved. Question the etiology but given patient's history of Nstemi in 11/2015 waned to monitor patient overnight and ensure there was no cardiac cause to her symptoms. - admit to a telemetry bed - troponins q3 hrs x 3 - continuous pulse oximetry - recheck ekg at 6 am - PT to eval and treat - 6 minute walk  Acute kidney injury on chronic kidney disease stage III: Patient has a baseline creatinine of 1.2 per last discharge acutely elevated at 1.77 on admission.  - IV fluids of normal saline - Check FeUa - Further investigation with renal ultrasound symptoms do not improve  - Held Lasix and lisinopril for now  Diarrhea: At this point unclear patients  having diarrhea or if symptoms are secondary to diuretic use - Strict ins and outs to evaluate if patient is actually having diarrhea - May warrant further evaluation   Fatigue:  Suspect secondary to dehydration - Continue to monitor   Hypothyroidism - Check TSH - Continue levothyroxine for now and adjust if needed  Systolic and diastolic congestive heart failure: Stable no signs of acute exacerbation on physical exam or lab work as BNP was within normal limits. Last echo noted EF to be 40-45% in 11/2015. - strict in and out.  History of NSTEMI: as seen above  Hypertension - Restart lisinopril when able  - Continue Coreg  Anemia: Hemoglobin appears stable at 10.9 similar to previous admissions in the last 2 months. - Continue to monitor  Thrombocytopenia:Acute on chronic. Patient seen have low platelet counts intermittently even in 10/2011 - Continue to monitor  Dementia: acute question o this medication was started and if this is causing symptoms of anxiety the patient is now having.  - Continue [place] work: To evaluate home care needs   Lovenox for DVT prophylaxis  Code Status:   full Family Communication: bedside Disposition Plan: admit   Total time spent 55 minutes.Greater than 50% of this  time was spent in counseling, explanation of diagnosis, planning of further management, and coordination of care  [facility] Pager [phone]  If 7PM-7AM, please contact night-coverage [email or web address] Password [email or web address] 03/02/2016, 10:13 PM

## 2016-03-02 NOTE — ED Notes (Signed)
Daughter notified pt had a room on 6E26.

## 2016-03-02 NOTE — ED Provider Notes (Signed)
CSN: [id number]     Arrival date & time 03/02/16  1831 History   First MD Initiated Contact with Patient 03/02/16 1851     Chief Complaint  Patient presents with  . Failure To Thrive  . Fatigue     (Consider location/radiation/quality/duration/timing/severity/associated sxs/prior Treatment) Patient is a 82 y.o. female presenting with general illness.  Illness Location:  Generalized Quality:  Fatigue Severity:  Severe Onset quality:  Sudden Duration: Several hours. Timing:  Constant Progression:  Unchanged Chronicity:  New Context:  Patient has a history of diastolic and systolic heart failure, complete heart block status post pacemaker, NSTEMI in 11/2015.  Relieved by:  Nothing tried Worsened by:  Nothing noted Associated symptoms: shortness of breath   Associated symptoms: no abdominal pain, no chest pain, no congestion, no cough, no diarrhea, no ear pain, no fatigue, no fever, no headaches, no myalgias, no nausea, no rash, no sore throat, no vomiting and no wheezing     Past Medical History  Diagnosis Date  . GERD   . GASTRIC ULCER   . CONSTIPATION   . Breast cancer ([place])   . Osteoporosis   . HTN (hypertension)   . Hypothyroidism   . Vitamin D deficiency   . Arthritis   . Complete heart block (HCC)     S/P pacemaker  . Cardiomyopathy    Past Surgical History  Procedure Laterality Date  . Cholecystectomy    . Doppler echocardiography  2007   Family History  Problem Relation Age of Onset  . Breast cancer Mother    Social History  Substance Use Topics  . Smoking status: Never Smoker   . Smokeless tobacco: Never Used  . Alcohol Use: No   OB History    No data available     Review of Systems  Constitutional: Negative for fever, chills, appetite change and fatigue.  HENT: Negative for congestion, ear pain, facial swelling, mouth sores and sore throat.   Eyes: Negative for visual disturbance.  Respiratory: Positive for shortness of breath. Negative for  cough, chest tightness and wheezing.   Cardiovascular: Negative for chest pain and palpitations.  Gastrointestinal: Negative for nausea, vomiting, abdominal pain, diarrhea and blood in stool.  Endocrine: Negative for cold intolerance and heat intolerance.  Genitourinary: Negative for frequency, decreased urine volume and difficulty urinating.  Musculoskeletal: Negative for myalgias, back pain and neck stiffness.  Skin: Negative for rash.  Neurological: Negative for dizziness, weakness, light-headedness and headaches.  All other systems reviewed and are negative.     Allergies  Pramipexole dihydrochloride and Sulfonamide derivatives  Home Medications   Prior to Admission medications   Medication Sig Start Date End Date Taking? Authorizing Provider  aspirin EC 81 MG tablet Take 81 mg by mouth daily.   Yes Historical Provider, MD  carvedilol (COREG) 6.25 MG tablet TAKE 1 TABLET BY MOUTH TWICE A DAY WITH A MEAL 11/08/15  Yes Evans Lance, MD  cholecalciferol (VITAMIN D) 1000 UNITS tablet Take 1,000 Units by mouth 2 (two) times daily.    Yes Historical Provider, MD  furosemide (LASIX) 20 MG tablet Take 1 tablet (20 mg total) by mouth daily. 11/16/15  Yes Brett Canales, PA-C  levothyroxine (SYNTHROID, LEVOTHROID) 125 MCG tablet Take 62.5 mcg by mouth daily before breakfast. 02/15/16  Yes Historical Provider, MD  lisinopril (PRINIVIL,ZESTRIL) 10 MG tablet Take 1 tablet (10 mg total) by mouth daily. 07/07/15  Yes Evans Lance, MD  memantine (NAMENDA) 10 MG tablet Take 10 mg  by mouth 2 (two) times daily. 10/20/15  Yes Historical Provider, MD  pantoprazole (PROTONIX) 40 MG tablet Take 40 mg by mouth daily.   Yes Historical Provider, MD  potassium chloride SA (K-DUR,KLOR-CON) 20 MEQ tablet Take 1 tablet (20 mEq total) by mouth daily. 11/16/15  Yes Brett Canales, PA-C  vitamin B-12 (CYANOCOBALAMIN) 1000 MCG tablet Take 500 mcg by mouth daily.    Yes Historical Provider, MD   BP 153/72 mmHg  Pulse  60  Temp(Src) 97.7 F (36.5 C) (Oral)  Resp 17  Ht 5\' 4"  (1.626 m)  Wt 44.2 kg  BMI 16.72 kg/m2  SpO2 99% Physical Exam  Constitutional: She is oriented to person, place, and time. She appears well-developed and well-nourished. No distress.  HENT:  Head: Normocephalic and atraumatic.  Right Ear: External ear normal.  Left Ear: External ear normal.  Nose: Nose normal.  Eyes: Conjunctivae and EOM are normal. Pupils are equal, round, and reactive to light. Right eye exhibits no discharge. Left eye exhibits no discharge. No scleral icterus.  Neck: Normal range of motion. Neck supple.  Cardiovascular: Normal rate, regular rhythm and normal heart sounds.  Exam reveals no gallop and no friction rub.   No murmur heard. Pulmonary/Chest: Effort normal and breath sounds normal. No stridor. No respiratory distress. She has no wheezes.  Abdominal: Soft. She exhibits no distension. There is no tenderness.  Musculoskeletal: She exhibits no edema or tenderness.  Neurological: She is alert and oriented to person, place, and time.  Skin: Skin is warm and dry. No rash noted. She is not diaphoretic. No erythema.  Psychiatric: She has a normal mood and affect.    ED Course  Procedures (including critical care time) Labs Review Labs Reviewed  CBC WITH DIFFERENTIAL/PLATELET - Abnormal; Notable for the following:    RBC 3.61 (*)    Hemoglobin 10.9 (*)    HCT 34.4 (*)    Platelets 141 (*)    All other components within normal limits  COMPREHENSIVE METABOLIC PANEL - Abnormal; Notable for the following:    Glucose, Bld 131 (*)    BUN 36 (*)    Creatinine, Ser 1.77 (*)    ALT 12 (*)    Total Bilirubin 1.4 (*)    GFR calc non Af Amer 25 (*)    GFR calc Af Amer 29 (*)    All other components within normal limits  BRAIN NATRIURETIC PEPTIDE  TROPONIN I  TSH  TROPONIN I  TROPONIN I  CREATININE, URINE, RANDOM  UREA NITROGEN, URINE  I-STAT TROPOININ, ED    Imaging Review Dg Chest 2  View  03/02/2016  CLINICAL DATA:  Shortness of breath for 1 day. Cardiomyopathy. Complete heart block. Personal history of breast carcinoma. EXAM: CHEST  2 VIEW COMPARISON:  01/13/2016 FINDINGS: The heart size and mediastinal contours are within normal limits. Dual lead transvenous pacemaker remains in appropriate position. Both lungs are clear. No evidence of pneumothorax or pleural effusion. Surgical clips noted in right axilla. IMPRESSION: No active cardiopulmonary disease. Electronically Signed   By: Earle Gell M.D.   On: 03/02/2016 19:33   I have personally reviewed and evaluated these images and lab results as part of my medical decision-making.   EKG Interpretation   Date/Time:  Friday March 02 2016 18:40:43 EST Ventricular Rate:  60 PR Interval:  221 QRS Duration: 142 QT Interval:  467 QTC Calculation: 467 R Axis:   -69 Text Interpretation:  Atrial-paced rhythm RBBB and LAFB Probable left  ventricular hypertrophy no significant change since Jan 2017 Reconfirmed  by [facility]  MD, SCOTT [phone]) on 03/02/2016 6:47:43 PM      MDM   Sudden onset fatigue and shortness of breath. Shortness of breath resolved. EKG with atrial paced rhythm and right bundle branch block. No evidence of acute ischemia. Initial troponin negative. Given the patient's history she will require troponin trending to rule out ACS.  Chest x-ray without evidence of pneumonia, pneumothorax, pneumomediastinum, pleural effusions, pulmonary edema. Mediastinum without abnormal contour to suggest dissection. Exam without evidence of volume overload. BNP within normal limits. Low concern for CHF exacerbation.  Labs with mild acute renal injury.  Patient will be admitted for continued workup and management.  Diagnostic studies interpreted by me and use to my clinical decision-making. Next  Patient seen in conjunction with Dr. Regenia Skeeter.  Final diagnoses:  Other fatigue        Addison Lank, MD 03/03/16  [id number]  Sherwood Gambler, MD 03/03/16 [phone]

## 2016-03-02 NOTE — ED Notes (Signed)
Pt brought in EMS from home for failure to thrive and fatigue.  EMS reports that it was very cold in pt home and pt originally c/o some SOB.  Once pt was warmed up, pt only c/o being tired per EMS.  Pt has hx of dementia.  Per EMS, pt was walking outside with no coat/hat and was walking "to the neighbors house" which is aprox. 3/4 mile from pt home.

## 2016-03-03 DIAGNOSIS — D649 Anemia, unspecified: Secondary | ICD-10-CM | POA: Diagnosis present

## 2016-03-03 DIAGNOSIS — D696 Thrombocytopenia, unspecified: Secondary | ICD-10-CM | POA: Diagnosis present

## 2016-03-03 DIAGNOSIS — I252 Old myocardial infarction: Secondary | ICD-10-CM | POA: Diagnosis not present

## 2016-03-03 DIAGNOSIS — N183 Chronic kidney disease, stage 3 (moderate): Secondary | ICD-10-CM | POA: Diagnosis present

## 2016-03-03 DIAGNOSIS — Z7982 Long term (current) use of aspirin: Secondary | ICD-10-CM | POA: Diagnosis not present

## 2016-03-03 DIAGNOSIS — E039 Hypothyroidism, unspecified: Secondary | ICD-10-CM | POA: Diagnosis present

## 2016-03-03 DIAGNOSIS — F0390 Unspecified dementia without behavioral disturbance: Secondary | ICD-10-CM | POA: Diagnosis present

## 2016-03-03 DIAGNOSIS — M810 Age-related osteoporosis without current pathological fracture: Secondary | ICD-10-CM | POA: Diagnosis present

## 2016-03-03 DIAGNOSIS — R627 Adult failure to thrive: Secondary | ICD-10-CM | POA: Diagnosis present

## 2016-03-03 DIAGNOSIS — R0600 Dyspnea, unspecified: Secondary | ICD-10-CM

## 2016-03-03 DIAGNOSIS — K257 Chronic gastric ulcer without hemorrhage or perforation: Secondary | ICD-10-CM | POA: Diagnosis not present

## 2016-03-03 DIAGNOSIS — I5043 Acute on chronic combined systolic (congestive) and diastolic (congestive) heart failure: Secondary | ICD-10-CM

## 2016-03-03 DIAGNOSIS — N189 Chronic kidney disease, unspecified: Secondary | ICD-10-CM

## 2016-03-03 DIAGNOSIS — M1990 Unspecified osteoarthritis, unspecified site: Secondary | ICD-10-CM | POA: Diagnosis present

## 2016-03-03 DIAGNOSIS — I129 Hypertensive chronic kidney disease with stage 1 through stage 4 chronic kidney disease, or unspecified chronic kidney disease: Secondary | ICD-10-CM | POA: Diagnosis not present

## 2016-03-03 DIAGNOSIS — E559 Vitamin D deficiency, unspecified: Secondary | ICD-10-CM | POA: Diagnosis present

## 2016-03-03 DIAGNOSIS — N179 Acute kidney failure, unspecified: Principal | ICD-10-CM

## 2016-03-03 DIAGNOSIS — E870 Hyperosmolality and hypernatremia: Secondary | ICD-10-CM | POA: Diagnosis present

## 2016-03-03 DIAGNOSIS — I130 Hypertensive heart and chronic kidney disease with heart failure and stage 1 through stage 4 chronic kidney disease, or unspecified chronic kidney disease: Secondary | ICD-10-CM | POA: Diagnosis present

## 2016-03-03 DIAGNOSIS — Z950 Presence of cardiac pacemaker: Secondary | ICD-10-CM | POA: Diagnosis not present

## 2016-03-03 DIAGNOSIS — I4510 Unspecified right bundle-branch block: Secondary | ICD-10-CM | POA: Diagnosis present

## 2016-03-03 DIAGNOSIS — I5042 Chronic combined systolic (congestive) and diastolic (congestive) heart failure: Secondary | ICD-10-CM | POA: Diagnosis present

## 2016-03-03 DIAGNOSIS — I429 Cardiomyopathy, unspecified: Secondary | ICD-10-CM | POA: Diagnosis present

## 2016-03-03 DIAGNOSIS — K529 Noninfective gastroenteritis and colitis, unspecified: Secondary | ICD-10-CM | POA: Diagnosis present

## 2016-03-03 DIAGNOSIS — R0689 Other abnormalities of breathing: Secondary | ICD-10-CM | POA: Diagnosis not present

## 2016-03-03 DIAGNOSIS — Z853 Personal history of malignant neoplasm of breast: Secondary | ICD-10-CM | POA: Diagnosis not present

## 2016-03-03 DIAGNOSIS — K219 Gastro-esophageal reflux disease without esophagitis: Secondary | ICD-10-CM | POA: Diagnosis present

## 2016-03-03 LAB — BASIC METABOLIC PANEL
ANION GAP: 15 (ref 5–15)
BUN: 36 mg/dL — ABNORMAL HIGH (ref 6–20)
CHLORIDE: 109 mmol/L (ref 101–111)
CO2: 23 mmol/L (ref 22–32)
Calcium: 9.1 mg/dL (ref 8.9–10.3)
Creatinine, Ser: 1.76 mg/dL — ABNORMAL HIGH (ref 0.44–1.00)
GFR calc non Af Amer: 26 mL/min — ABNORMAL LOW (ref >60)
GFR, EST AFRICAN AMERICAN: 30 mL/min — AB (ref >60)
GLUCOSE: 108 mg/dL — AB (ref 65–99)
Potassium: 3.8 mmol/L (ref 3.5–5.1)
Sodium: 147 mmol/L — ABNORMAL HIGH (ref 135–145)

## 2016-03-03 LAB — TROPONIN I: Troponin I: 0.03 ng/mL (ref <0.031)

## 2016-03-03 LAB — TSH: TSH: 1.134 u[IU]/mL (ref 0.350–4.500)

## 2016-03-03 MED ORDER — DEXTROSE 5 % IV SOLN
INTRAVENOUS | Status: DC
  Administered 2016-03-03 – 2016-03-04 (×2): via INTRAVENOUS

## 2016-03-03 NOTE — Evaluation (Signed)
Physical Therapy Evaluation Patient Details Name: [name] MRN: [id number] DOB: October 26, 1930 Today's Date: 03/03/2016   History of Present Illness  Ms. [name] is a 82 year old female with a past medical history significant for systolic and diastolic congestive heart failure, and NSTEMI in November 2016, dementia, hypothyroidism, anemia, hypertension; who presents with vague symptoms of fatigue and shortness of breath  Clinical Impression  Pt admitted with above complications. Pt currently with functional limitations due to the deficits listed below (see PT Problem List). Confused and agitated, stating she has been kidnapped but aware of month, day, and year. Demonstrates instability requiring physical assist for balance. Unsafe when attempting to navigate steps. Apparently lives with a friend who is disabled and patient admits to having multiple falls at home. Pt will benefit from skilled PT to increase their independence and safety with mobility to allow discharge to the venue listed below.       Follow Up Recommendations SNF;Supervision/Assistance - 24 hour - If pt does not qualify for SNF, strongly urge maximizing home health services including HHPT and aide.    Equipment Recommendations  Rolling walker with 5" wheels    Recommendations for Other Services       Precautions / Restrictions Precautions Precautions: Fall Restrictions Weight Bearing Restrictions: No      Mobility  Bed Mobility               General bed mobility comments: sitting in recliner  Transfers Overall transfer level: Needs assistance Equipment used: None Transfers: Sit to/from Stand Sit to Stand: Supervision         General transfer comment: supervision for safety. Slow to rise. Refuses RW for support.  Ambulation/Gait Ambulation/Gait assistance: Min assist Ambulation Distance (Feet): 150 Feet Assistive device: 1 person hand held assist Gait Pattern/deviations: Step-through  pattern;Decreased stride length;Staggering left;Staggering right;Scissoring;Drifts right/left Gait velocity: decreased Gait velocity interpretation: Below normal speed for age/gender General Gait Details: Refuses to use RW for support. Required min assist on several occasions to correct balance, could not remember where room was. Cues for awareness and safety. Staggering at times with scissored gait during turns.  Stairs Stairs: Yes Stairs assistance: Mod assist Stair Management: One rail Right;Step to pattern;Forwards Number of Stairs: 4 General stair comments: Cleared several steps without difficulty but then looses focus and required mod assist to prevent fall due to loss of balance.  Wheelchair Mobility    Modified Rankin (Stroke Patients Only)       Balance Overall balance assessment: Needs assistance;History of Falls Sitting-balance support: No upper extremity supported;Feet supported Sitting balance-Leahy Scale: Normal     Standing balance support: No upper extremity supported Standing balance-Leahy Scale: Fair                               Pertinent Vitals/Pain Pain Assessment: No/denies pain    Home Living Family/patient expects to be discharged to:: Private residence Living Arrangements: Non-relatives/Friends Available Help at Discharge: Family;Friend(s);Available PRN/intermittently Type of Home: Mobile home Home Access: Stairs to enter Entrance Stairs-Rails: Right Entrance Stairs-Number of Steps: 4 Home Layout: One level Home Equipment: Cane - single point;Shower seat Additional Comments: Most information taken from prior admission as pt is a poor historian. Pt lives with a friend who is not in good health, and daughter lives next door. Pt reports multiple falls at home.    Prior Function Level of Independence: Independent with assistive device(s)  Comments: states she uses a cane but has had multiple falls     Hand Dominance         Extremity/Trunk Assessment   Upper Extremity Assessment: Defer to OT evaluation           Lower Extremity Assessment: Generalized weakness;Difficult to assess due to impaired cognition         Communication   Communication: No difficulties  Cognition Arousal/Alertness: Awake/alert Behavior During Therapy: Agitated Overall Cognitive Status: No family/caregiver present to determine baseline cognitive functioning Area of Impairment: Orientation;Safety/judgement;Problem solving Orientation Level: Disoriented to;Place;Situation (knows date)   Memory: Decreased short-term memory   Safety/Judgement: Decreased awareness of safety;Decreased awareness of deficits   Problem Solving: Difficulty sequencing;Requires verbal cues General Comments: Statse she has been kidnapped, reoriented.    General Comments General comments (skin integrity, edema, etc.): Demonstrates lack of safety awareness.  SpO2 95% on room air at rest.    Exercises        Assessment/Plan    PT Assessment Patient needs continued PT services  PT Diagnosis Difficulty walking;Abnormality of gait;Generalized weakness;Altered mental status   PT Problem List Decreased strength;Decreased activity tolerance;Decreased balance;Decreased mobility;Decreased coordination;Decreased cognition;Decreased knowledge of use of DME;Decreased safety awareness  PT Treatment Interventions DME instruction;Gait training;Stair training;Functional mobility training;Therapeutic activities;Therapeutic exercise;Balance training;Patient/family education;Cognitive remediation   PT Goals (Current goals can be found in the Care Plan section) Acute Rehab PT Goals Patient Stated Goal: Get out of here PT Goal Formulation: With patient Potential to Achieve Goals: Good    Frequency Min 2X/week   Barriers to discharge Decreased caregiver support lives with disabled friend    Co-evaluation               End of Session Equipment  Utilized During Treatment: Gait belt Activity Tolerance: Patient tolerated treatment well Patient left: in chair;with call bell/phone within reach;with chair alarm set;with nursing/sitter in room Nurse Communication: Mobility status    Functional Assessment Tool Used: clinical observation Functional Limitation: Mobility: Walking and moving around Mobility: Walking and Moving Around Current Status [id number]): At least 20 percent but less than 40 percent impaired, limited or restricted Mobility: Walking and Moving Around Goal Status [phone]): At least 1 percent but less than 20 percent impaired, limited or restricted    Time: [id number] PT Time Calculation (min) (ACUTE ONLY): 9 min   Charges:   PT Evaluation $PT Eval Low Complexity: 1 Procedure     PT G Codes:   PT G-Codes **NOT FOR INPATIENT CLASS** Functional Assessment Tool Used: clinical observation Functional Limitation: Mobility: Walking and moving around Mobility: Walking and Moving Around Current Status [id number]): At least 20 percent but less than 40 percent impaired, limited or restricted Mobility: Walking and Moving Around Goal Status [phone]): At least 1 percent but less than 20 percent impaired, limited or restricted    Ellouise Newer 03/03/2016, 11:27 AM Elayne Snare, [place]

## 2016-03-03 NOTE — Progress Notes (Signed)
[facility] PROGRESS NOTE  [name] [id number] DOB: 11-12-1930 DOA: 03/02/2016 PCP: Fae Pippin  Assessment/Plan: Dyspnea: Acute. Patient with acute symptoms of shortness of breath now resolved. Question the etiology but given patient's history of Nstemi in 11/2015 waned to monitor patient overnight and ensure there was no cardiac cause to her symptoms. - troponins q3 hrs x 3, negative -resolved.  No evidence of HF exacerbation.   Acute kidney injury on chronic kidney disease stage III: Patient has a baseline creatinine of 1.2 per last discharge acutely elevated at 1.77 on admission.  -mild hypernatremia, cr still at 1.7. Change IV fluids to D 5.  - Held Lasix and lisinopril for now  Diarrhea:  - suspect gastroenteritis. No further episode in the hospital  - May warrant further evaluation   Fatigue: Suspect secondary to dehydration - Continue to monitor  Hypothyroidism -  TSH at 1.1 - Continue levothyroxine for now and adjust if needed  Systolic and diastolic congestive heart failure:  Stable no signs of acute exacerbation on physical exam or lab work as BNP was within normal limits. Last echo noted EF to be 40-45% in 11/2015. - strict in and out.  Hypertension - Restart lisinopril when able  - Continue Coreg  Anemia: Hemoglobin appears stable at 10.9 similar to previous admissions in the last 2 months. - Continue to monitor  Thrombocytopenia:Acute on chronic. Patient seen have low platelet counts intermittently even in 10/2011 - Continue to monitor  Dementia: - Continue Namenda  Gerd - Continue Protonix  Code Status: full code.  Family Communication: daughter, asking for SNF placement  Disposition Plan: discharge in 85 to 48 hours.    Consultants:  none  Procedures:  none  Antibiotics:  none  HPI/Subjective: Feeling better, no more nausea, diarrhea, and dizziness has improved.  Dyspnea has improved.    Objective: Filed Vitals:   03/03/16 0608 03/03/16 0900  BP: 153/49 154/90  Pulse: 64 68  Temp: 98.5 F (36.9 C) 98 F (36.7 C)  Resp: 18 18    Intake/Output Summary (Last 24 hours) at 03/03/16 1641 Last data filed at 03/03/16 0901  Gross per 24 hour  Intake 778.75 ml  Output    300 ml  Net 478.75 ml   Filed Weights   03/02/16 1839 03/02/16 2244  Weight: 45.36 kg (100 lb) 44.2 kg (97 lb 7.1 oz)    Exam:   General:  NAD  Cardiovascular: S 1, S 2 RRR  Respiratory: CTA  Abdomen: Bs present, soft, nt  Musculoskeletal: no edema  Data Reviewed: Basic Metabolic Panel:  Recent Labs Lab 03/02/16 1857 03/03/16 0753  NA 143 147*  K 4.3 3.8  CL 105 109  CO2 24 23  GLUCOSE 131* 108*  BUN 36* 36*  CREATININE 1.77* 1.76*  CALCIUM 9.3 9.1   Liver Function Tests:  Recent Labs Lab 03/02/16 1857  AST 18  ALT 12*  ALKPHOS 63  BILITOT 1.4*  PROT 6.6  ALBUMIN 3.9   No results for input(s): LIPASE, AMYLASE in the last 168 hours. No results for input(s): AMMONIA in the last 168 hours. CBC:  Recent Labs Lab 03/02/16 1857  WBC 6.8  NEUTROABS 5.1  HGB 10.9*  HCT 34.4*  MCV 95.3  PLT 141*   Cardiac Enzymes:  Recent Labs Lab 03/02/16 2245 03/03/16 0044 03/03/16 0434  TROPONINI 0.03 <0.03 0.03   BNP (last 3 results)  Recent Labs  11/13/15 1855 01/13/16 1441 03/02/16 1857  BNP 1217.1* 43.6 96.1  ProBNP (last 3 results) No results for input(s): PROBNP in the last 8760 hours.  CBG: No results for input(s): GLUCAP in the last 168 hours.  No results found for this or any previous visit (from the past 240 hour(s)).   Studies: Dg Chest 2 View  03/02/2016  CLINICAL DATA:  Shortness of breath for 1 day. Cardiomyopathy. Complete heart block. Personal history of breast carcinoma. EXAM: CHEST  2 VIEW COMPARISON:  01/13/2016 FINDINGS: The heart size and mediastinal contours are within normal limits. Dual lead transvenous pacemaker remains in  appropriate position. Both lungs are clear. No evidence of pneumothorax or pleural effusion. Surgical clips noted in right axilla. IMPRESSION: No active cardiopulmonary disease. Electronically Signed   By: Earle Gell M.D.   On: 03/02/2016 19:33    Scheduled Meds: . aspirin EC  81 mg Oral Daily  . carvedilol  6.25 mg Oral BID WC  . cholecalciferol  1,000 Units Oral BID  . vitamin B-12  500 mcg Oral Daily  . enoxaparin (LOVENOX) injection  30 mg Subcutaneous Q24H  . levothyroxine  62.5 mcg Oral QAC breakfast  . memantine  10 mg Oral BID  . pantoprazole  40 mg Oral Daily   Continuous Infusions: . dextrose      Principal Problem:   Dyspnea Active Problems:   GERD   Hypertension   Acute on chronic combined systolic and diastolic HF (heart failure) (HCC)   Diarrhea   Fatigue   Acute kidney injury superimposed on chronic kidney disease (HCC)   Hypothyroidism    Time spent: 25 minutes.     Niel Hummer A  [facility] Pager [phone]. If 7PM-7AM, please contact night-coverage at [email or web address], password [facility] 03/03/2016, 4:41 PM

## 2016-03-04 LAB — BASIC METABOLIC PANEL
ANION GAP: 9 (ref 5–15)
BUN: 25 mg/dL — ABNORMAL HIGH (ref 6–20)
CALCIUM: 8.8 mg/dL — AB (ref 8.9–10.3)
CO2: 24 mmol/L (ref 22–32)
Chloride: 109 mmol/L (ref 101–111)
Creatinine, Ser: 1.2 mg/dL — ABNORMAL HIGH (ref 0.44–1.00)
GFR, EST AFRICAN AMERICAN: 47 mL/min — AB (ref >60)
GFR, EST NON AFRICAN AMERICAN: 41 mL/min — AB (ref >60)
Glucose, Bld: 114 mg/dL — ABNORMAL HIGH (ref 65–99)
POTASSIUM: 4.3 mmol/L (ref 3.5–5.1)
Sodium: 142 mmol/L (ref 135–145)

## 2016-03-04 LAB — CBC
HEMATOCRIT: 31.9 % — AB (ref 36.0–46.0)
Hemoglobin: 10.2 g/dL — ABNORMAL LOW (ref 12.0–15.0)
MCH: 30.2 pg (ref 26.0–34.0)
MCHC: 32 g/dL (ref 30.0–36.0)
MCV: 94.4 fL (ref 78.0–100.0)
PLATELETS: 126 10*3/uL — AB (ref 150–400)
RBC: 3.38 MIL/uL — AB (ref 3.87–5.11)
RDW: 13.2 % (ref 11.5–15.5)
WBC: 5 10*3/uL (ref 4.0–10.5)

## 2016-03-04 MED ORDER — FUROSEMIDE 20 MG PO TABS
20.0000 mg | ORAL_TABLET | Freq: Every day | ORAL | Status: DC
  Administered 2016-03-05 – 2016-03-06 (×2): 20 mg via ORAL
  Filled 2016-03-04 (×2): qty 1

## 2016-03-04 NOTE — Progress Notes (Signed)
CSW rec'd call from unit about patient needing placement. CSW met with patient and daughter, [name], at bedside and discussed SNF. Patient has not had a 3 night INPATIENT stay (admitted as an OBS) and per MD will not need to stay 2 more nights to be eligible. Furthermore, patient ambulated 150 feet min assist with PT. [place]. The granddaughter and a caregiver live with the patient and provide assistance. Discussed possible dc home with [facility] services-  Per daughter, her daughter works for [employer] and this would be their preferred [facility] provider- CSW has updated MD and RNCM. Will have weekday CSW followup tomorrow   Eduard Clos, MSW, LCSW   [phone]

## 2016-03-04 NOTE — Progress Notes (Signed)
[facility] PROGRESS NOTE  [name] [id number] DOB: Jun 10, 1930 DOA: 03/02/2016 PCP: Fae Pippin  Assessment/Plan: Dyspnea: Acute. Patient with acute symptoms of shortness of breath now resolved. Question the etiology but given patient's history of Nstemi in 11/2015 waned to monitor patient overnight and ensure there was no cardiac cause to her symptoms. - troponins q3 hrs x 3, negative -resolved.  No evidence of HF exacerbation.  Will resume home dose of lasix tomorrow.   Acute kidney injury on chronic kidney disease stage III: Patient has a baseline creatinine of 1.2 per last discharge acutely elevated at 1.77 on admission.  -mild hypernatremia, cr still at 1.7. Change IV fluids to D 5.  - Held  lisinopril for now -resume lasix tomorrow.   Diarrhea:  - suspect gastroenteritis. No further episode in the hospital  - May warrant further evaluation   Fatigue: Suspect secondary to dehydration - Continue to monitor  Hypothyroidism -  TSH at 1.1 - Continue levothyroxine for now and adjust if needed  Systolic and diastolic congestive heart failure:  Stable no signs of acute exacerbation on physical exam or lab work as BNP was within normal limits. Last echo noted EF to be 40-45% in 11/2015. - strict in and out. -resume home dose lasix tomorrow.   Hypertension - Restart lisinopril when able  - Continue Coreg  Anemia: Hemoglobin appears stable at 10.9 similar to previous admissions in the last 2 months. - Continue to monitor  Thrombocytopenia:Acute on chronic. Patient seen have low platelet counts intermittently even in 10/2011 - Continue to monitor  Dementia: - Continue Namenda  Gerd - Continue Protonix  Code Status: full code.  Family Communication: daughter, asking for SNF placement  Disposition Plan: discharge in 28 to 48 hours.    Consultants:  none  Procedures:  none  Antibiotics:  none  HPI/Subjective: Feeling better,  no more nausea, diarrhea, and dizziness has improved.  Dyspnea has improved.   Objective: Filed Vitals:   03/04/16 0518 03/04/16 0851  BP: 158/59 153/64  Pulse: 60 69  Temp: 97.7 F (36.5 C) 98.4 F (36.9 C)  Resp: 20 20    Intake/Output Summary (Last 24 hours) at 03/04/16 1340 Last data filed at 03/04/16 1000  Gross per 24 hour  Intake 1562.5 ml  Output    800 ml  Net  762.5 ml   Filed Weights   03/02/16 1839 03/02/16 2244 03/03/16 2021  Weight: 45.36 kg (100 lb) 44.2 kg (97 lb 7.1 oz) 45.2 kg (99 lb 10.4 oz)    Exam:   General:  NAD  Cardiovascular: S 1, S 2 RRR  Respiratory: CTA  Abdomen: Bs present, soft, nt  Musculoskeletal: no edema  Data Reviewed: Basic Metabolic Panel:  Recent Labs Lab 03/02/16 1857 03/03/16 0753 03/04/16 0557  NA 143 147* 142  K 4.3 3.8 4.3  CL 105 109 109  CO2 24 23 24   GLUCOSE 131* 108* 114*  BUN 36* 36* 25*  CREATININE 1.77* 1.76* 1.20*  CALCIUM 9.3 9.1 8.8*   Liver Function Tests:  Recent Labs Lab 03/02/16 1857  AST 18  ALT 12*  ALKPHOS 63  BILITOT 1.4*  PROT 6.6  ALBUMIN 3.9   No results for input(s): LIPASE, AMYLASE in the last 168 hours. No results for input(s): AMMONIA in the last 168 hours. CBC:  Recent Labs Lab 03/02/16 1857 03/04/16 0557  WBC 6.8 5.0  NEUTROABS 5.1  --   HGB 10.9* 10.2*  HCT 34.4* 31.9*  MCV 95.3  94.4  PLT 141* 126*   Cardiac Enzymes:  Recent Labs Lab 03/02/16 2245 03/03/16 0044 03/03/16 0434  TROPONINI 0.03 <0.03 0.03   BNP (last 3 results)  Recent Labs  11/13/15 1855 01/13/16 1441 03/02/16 1857  BNP 1217.1* 43.6 96.1    ProBNP (last 3 results) No results for input(s): PROBNP in the last 8760 hours.  CBG: No results for input(s): GLUCAP in the last 168 hours.  No results found for this or any previous visit (from the past 240 hour(s)).   Studies: Dg Chest 2 View  03/02/2016  CLINICAL DATA:  Shortness of breath for 1 day. Cardiomyopathy. Complete heart  block. Personal history of breast carcinoma. EXAM: CHEST  2 VIEW COMPARISON:  01/13/2016 FINDINGS: The heart size and mediastinal contours are within normal limits. Dual lead transvenous pacemaker remains in appropriate position. Both lungs are clear. No evidence of pneumothorax or pleural effusion. Surgical clips noted in right axilla. IMPRESSION: No active cardiopulmonary disease. Electronically Signed   By: Earle Gell M.D.   On: 03/02/2016 19:33    Scheduled Meds: . aspirin EC  81 mg Oral Daily  . carvedilol  6.25 mg Oral BID WC  . cholecalciferol  1,000 Units Oral BID  . vitamin B-12  500 mcg Oral Daily  . enoxaparin (LOVENOX) injection  30 mg Subcutaneous Q24H  . levothyroxine  62.5 mcg Oral QAC breakfast  . memantine  10 mg Oral BID  . pantoprazole  40 mg Oral Daily   Continuous Infusions:    Principal Problem:   Dyspnea Active Problems:   GERD   Hypertension   Acute on chronic combined systolic and diastolic HF (heart failure) (HCC)   Diarrhea   Fatigue   Acute kidney injury superimposed on chronic kidney disease (HCC)   Hypothyroidism    Time spent: 25 minutes.     Niel Hummer A  [facility] Pager [phone]. If 7PM-7AM, please contact night-coverage at [email or web address], password [facility] 03/04/2016, 1:40 PM  LOS: 1 day

## 2016-03-05 MED ORDER — HALOPERIDOL LACTATE 5 MG/ML IJ SOLN
2.5000 mg | Freq: Once | INTRAMUSCULAR | Status: AC
  Administered 2016-03-05: 2.5 mg via INTRAVENOUS
  Filled 2016-03-05: qty 1

## 2016-03-05 NOTE — NC FL2 (Signed)
[place] LEVEL OF CARE SCREENING TOOL     IDENTIFICATION  Patient Name: [name] Birthdate: Mar 29, 1930 Sex: female Admission Date (Current Location): 03/02/2016  [facility] and [place] Number:  [employer] and Address:  [facility] [place]. [facility], [place] [address] [place], [place] [address]      Provider Number: [id number]  Attending Physician Name and Address:  Elmarie Shiley, MD  Relative Name and Phone Number:  Phil Dopp, daughter.  Phone [phone]     Current Level of Care: Hospital Recommended Level of Care: [place] Prior Approval Number:    Date Approved/Denied:   PASRR Number: [id number] (Eff. 08/02/10)  Discharge Plan: SNF    Current Diagnoses: Patient Active Problem List   Diagnosis Date Noted  . Acute kidney injury superimposed on chronic kidney disease ([place]) 03/03/2016  . Hypothyroidism 03/03/2016  . Fatigue 03/02/2016  . Dyspnea 03/02/2016  . Diarrhea   . Hyperkalemia   . Dementia   . Loss of weight   . Poor appetite   . Renal insufficiency 01/13/2016  . AKI (acute kidney injury) ([place]) 01/13/2016  . Chest pain 11/15/2015  . Acute on chronic combined systolic and diastolic HF (heart failure) ([place]) 11/15/2015  . NSTEMI (non-ST elevated myocardial infarction) ([place]) 11/13/2015  . Dyspnea on exertion 08/05/2013  . Cardiomyopathy, nonischemic ([place]) 08/05/2013  . Pacemaker-[employer] 11/08/2011  . Hypertension 11/08/2011  . GASTRIC ULCER 10/04/2009  . GERD 08/22/2009  . CONSTIPATION 08/22/2009  . FECAL OCCULT BLOOD 08/22/2009    Orientation RESPIRATION BLADDER Height & Weight     Self  Normal Continent Weight: 99 lb 10.4 oz (45.2 kg) Height:  5\' 4"  (162.6 cm)  BEHAVIORAL SYMPTOMS/MOOD NEUROLOGICAL BOWEL NUTRITION STATUS      Continent Diet (Heart Healthy)  AMBULATORY STATUS COMMUNICATION OF NEEDS Skin   Limited Assist Verbally Other (Comment) (Ecchymosis-bilateral arms. Moisture  associated skin damage to buttocks-foam dressing used.)                       Personal Care Assistance Level of Assistance  Bathing, Feeding, Dressing Bathing Assistance: Limited assistance Feeding assistance: Independent Dressing Assistance: Limited assistance     Functional Limitations Info  Sight, Hearing, Speech Sight Info: Impaired (Wears glasses) Hearing Info: Adequate Speech Info: Adequate    SPECIAL CARE FACTORS FREQUENCY  PT (By licensed PT)     PT Frequency: Evaluated 3/4 - a minimum of 2X per week therapy recommended              Contractures Contractures Info: Not present    Additional Factors Info  Code Status, Allergies Code Status Info: Full code Allergies Info: Pramipexole Dihydrochloride, Sulfonamide Derivatives           Current Medications (03/05/2016):  This is the current hospital active medication list Current Facility-Administered Medications  Medication Dose Route Frequency Provider Last Rate Last Dose  . acetaminophen (TYLENOL) tablet 650 mg  650 mg Oral Q4H PRN Norval Morton, MD   650 mg at 03/05/16 0212  . aspirin EC tablet 81 mg  81 mg Oral Daily Rondell Charmayne Sheer, MD   81 mg at 03/05/16 1102  . carvedilol (COREG) tablet 6.25 mg  6.25 mg Oral BID WC Rondell Charmayne Sheer, MD   6.25 mg at 03/05/16 1749  . cholecalciferol (VITAMIN D) tablet 1,000 Units  1,000 Units Oral BID Norval Morton, MD   1,000 Units at 03/05/16 1102  . enoxaparin (  LOVENOX) injection 30 mg  30 mg Subcutaneous Q24H Rondell A Tamala Julian, MD   30 mg at 03/04/16 2251  . furosemide (LASIX) tablet 20 mg  20 mg Oral Daily Belkys A Regalado, MD   20 mg at 03/05/16 1103  . gi cocktail (Maalox,Lidocaine,Donnatal)  30 mL Oral QID PRN Norval Morton, MD      . levothyroxine (SYNTHROID, LEVOTHROID) tablet 62.5 mcg  62.5 mcg Oral QAC breakfast Norval Morton, MD   62.5 mcg at 03/05/16 0811  . memantine (NAMENDA) tablet 10 mg  10 mg Oral BID Norval Morton, MD   10 mg at 03/05/16 1103  .  morphine 2 MG/ML injection 2 mg  2 mg Intravenous Q2H PRN Norval Morton, MD      . ondansetron (ZOFRAN) injection 4 mg  4 mg Intravenous Q6H PRN Rondell A Tamala Julian, MD      . pantoprazole (PROTONIX) EC tablet 40 mg  40 mg Oral Daily Norval Morton, MD   40 mg at 03/05/16 1101  . vitamin B-12 (CYANOCOBALAMIN) tablet 500 mcg  500 mcg Oral Daily Norval Morton, MD   500 mcg at 03/05/16 1102     Discharge Medications: Please see discharge summary for a list of discharge medications.  Relevant Imaging Results:  Relevant Lab Results:   Additional Information SS# [id number].  Sable Feil, LCSW

## 2016-03-05 NOTE — Clinical Social Work Note (Signed)
Patient assessed for skilled facility placement for ST rehab - full assessment to follow. Facility search initiated 3/6.   Lilton Pare Givens, MSW, LCSW Licensed Clinical Social Worker [place] [phone]

## 2016-03-05 NOTE — Progress Notes (Signed)
Patient  Refusing to sit in chair ,get in bed , Standing alongside wall , confused. Now being combative and very agitated so security was called. Notified [facility] on call  For med  So patient  Can calm down.

## 2016-03-05 NOTE — Progress Notes (Signed)
Physical Therapy Treatment Patient Details Name: [name] MRN: [id number] DOB: 05/23/1930 Today's Date: 03/05/2016    History of Present Illness Ms. Manganiello is a 82 year old female with a past medical history significant for systolic and diastolic congestive heart failure, and NSTEMI in November 2016, dementia, hypothyroidism, anemia, hypertension; who presents with vague symptoms of fatigue and shortness of breath    PT Comments    Continuing to follow; Perform Berg Balance Assessment, and score of 36/56 is indicative of significant fall risk  Follow Up Recommendations  SNF;Supervision/Assistance - 24 hour  I currently very much favor dc to SNF for post-acute rehab to maximize independence and safety with mobility prior to dc home; Still, if she does not qualify for SNF, recommend 24 hour assist at home, and [place]; Does she qualify for a [place]?     Equipment Recommendations  Rolling walker with 5" wheels (youth-sized)    Recommendations for Other Services       Precautions / Restrictions Precautions Precautions: Fall    Mobility  Bed Mobility                  Transfers Overall transfer level: Needs assistance Equipment used: Straight cane Transfers: Sit to/from Stand Sit to Stand: Min guard         General transfer comment: continued supervision for safety  Ambulation/Gait Ambulation/Gait assistance: Min assist Ambulation Distance (Feet): 150 Feet (or greater) Assistive device: Straight cane (and handheld assist) Gait Pattern/deviations: Step-through pattern;Decreased stride length;Staggering left;Staggering right;Scissoring;Drifts right/left Gait velocity: decreased   General Gait Details: More agreeable to using cane; still, needing bil UE support for safety with amb evidenced by multiple losses of balance, needing handheld assist to prevent fallsq   Stairs            Wheelchair Mobility     Modified Rankin (Stroke Patients Only)       Balance             Standing balance-Leahy Scale: Fair                      Cognition Arousal/Alertness: Awake/alert Behavior During Therapy: WFL for tasks assessed/performed;Impulsive Overall Cognitive Status: No family/caregiver present to determine baseline cognitive functioning Area of Impairment: Orientation;Safety/judgement;Problem solving Orientation Level: Disoriented to;Place;Situation (knows date)   Memory: Decreased short-term memory   Safety/Judgement: Decreased awareness of safety;Decreased awareness of deficits   Problem Solving: Difficulty sequencing;Requires verbal cues General Comments: tangential and disoriented conversation; still not completely aware she is in the hospital    Exercises      General Comments        Pertinent Vitals/Pain Pain Assessment: No/denies pain    Home Living                      Prior Function            PT Goals (current goals can now be found in the care plan section) Acute Rehab PT Goals Patient Stated Goal: Did not state PT Goal Formulation: With patient Potential to Achieve Goals: Good Progress towards PT goals: Progressing toward goals    Frequency  Min 2X/week    PT Plan Other (comment) (Noted that she may not qualify for SNF)    Co-evaluation             End of Session Equipment Utilized During Treatment: Gait belt Activity Tolerance: Patient tolerated treatment well Patient left: in chair;with call  bell/phone within reach;Other (comment) (sitting at nurse's station)     Time: [id number] PT Time Calculation (min) (ACUTE ONLY): 32 min  Charges:  $Gait Training: 23-37 mins                    G Codes:      Quin Hoop 03/05/2016, 4:32 PM  [place], [place]  Acute Rehabilitation Services Pager [phone] Office [phone]

## 2016-03-05 NOTE — Progress Notes (Signed)
[facility] PROGRESS NOTE  [name] [id number] DOB: November 03, 1930 DOA: 03/02/2016 PCP: Fae Pippin  Assessment/Plan: Dyspnea: Acute. Patient with acute symptoms of shortness of breath now resolved. Question the etiology but given patient's history of Nstemi in 11/2015 waned to monitor patient overnight and ensure there was no cardiac cause to her symptoms. - troponins q3 hrs x 3, negative -resolved.  No evidence of HF exacerbation.  Will resume home dose of lasix tomorrow.   Acute kidney injury on chronic kidney disease stage III: Patient has a baseline creatinine of 1.2 per last discharge acutely elevated at 1.77 on admission.  -mild hypernatremia, cr still at 1.7. Change IV fluids to D 5.  - Held  lisinopril for now -resume lasix today and repeat renal function tomorrow/   Diarrhea:  - suspect gastroenteritis. No further episode in the hospital  - May warrant further evaluation  Fatigue: Suspect secondary to dehydration - Continue to monitor  Hypothyroidism -  TSH at 1.1 - Continue levothyroxine for now and adjust if needed  Systolic and diastolic congestive heart failure:  Stable no signs of acute exacerbation on physical exam or lab work as BNP was within normal limits. Last echo noted EF to be 40-45% in 11/2015. - strict in and out. -resume home dose lasix today.   Hypertension - Restart lisinopril when able  - Continue Coreg  Anemia: Hemoglobin appears stable at 10.9 similar to previous admissions in the last 2 months. - Continue to monitor  Thrombocytopenia:Acute on chronic. Patient seen have low platelet counts intermittently even in 10/2011 - Continue to monitor  Dementia: - Continue Namenda  Gerd - Continue Protonix  Code Status: full code.  Family Communication: daughter.  Disposition Plan: discharge in 24.    Consultants:  none  Procedures:  none  Antibiotics:  none  HPI/Subjective: Feeling weak , denies  dyspnea, chest pain.   Objective: Filed Vitals:   03/05/16 0448 03/05/16 1018  BP: 109/56 145/85  Pulse: 60 61  Temp: 98.5 F (36.9 C) 98 F (36.7 C)  Resp: 19 18    Intake/Output Summary (Last 24 hours) at 03/05/16 1700 Last data filed at 03/05/16 0600  Gross per 24 hour  Intake    580 ml  Output    175 ml  Net    405 ml   Filed Weights   03/02/16 1839 03/02/16 2244 03/03/16 2021  Weight: 45.36 kg (100 lb) 44.2 kg (97 lb 7.1 oz) 45.2 kg (99 lb 10.4 oz)    Exam:   General:  NAD  Cardiovascular: S 1, S 2 RRR  Respiratory: CTA  Abdomen: Bs present, soft, nt  Musculoskeletal: no edema  Data Reviewed: Basic Metabolic Panel:  Recent Labs Lab 03/02/16 1857 03/03/16 0753 03/04/16 0557  NA 143 147* 142  K 4.3 3.8 4.3  CL 105 109 109  CO2 24 23 24   GLUCOSE 131* 108* 114*  BUN 36* 36* 25*  CREATININE 1.77* 1.76* 1.20*  CALCIUM 9.3 9.1 8.8*   Liver Function Tests:  Recent Labs Lab 03/02/16 1857  AST 18  ALT 12*  ALKPHOS 63  BILITOT 1.4*  PROT 6.6  ALBUMIN 3.9   No results for input(s): LIPASE, AMYLASE in the last 168 hours. No results for input(s): AMMONIA in the last 168 hours. CBC:  Recent Labs Lab 03/02/16 1857 03/04/16 0557  WBC 6.8 5.0  NEUTROABS 5.1  --   HGB 10.9* 10.2*  HCT 34.4* 31.9*  MCV 95.3 94.4  PLT 141* 126*  Cardiac Enzymes:  Recent Labs Lab 03/02/16 2245 03/03/16 0044 03/03/16 0434  TROPONINI 0.03 <0.03 0.03   BNP (last 3 results)  Recent Labs  11/13/15 1855 01/13/16 1441 03/02/16 1857  BNP 1217.1* 43.6 96.1    ProBNP (last 3 results) No results for input(s): PROBNP in the last 8760 hours.  CBG: No results for input(s): GLUCAP in the last 168 hours.  No results found for this or any previous visit (from the past 240 hour(s)).   Studies: No results found.  Scheduled Meds: . aspirin EC  81 mg Oral Daily  . carvedilol  6.25 mg Oral BID WC  . cholecalciferol  1,000 Units Oral BID  . enoxaparin  (LOVENOX) injection  30 mg Subcutaneous Q24H  . furosemide  20 mg Oral Daily  . levothyroxine  62.5 mcg Oral QAC breakfast  . memantine  10 mg Oral BID  . pantoprazole  40 mg Oral Daily  . vitamin B-12  500 mcg Oral Daily   Continuous Infusions:    Principal Problem:   Dyspnea Active Problems:   GERD   Hypertension   Acute on chronic combined systolic and diastolic HF (heart failure) (HCC)   Diarrhea   Fatigue   Acute kidney injury superimposed on chronic kidney disease (HCC)   Hypothyroidism    Time spent: 25 minutes.     Niel Hummer A  [facility] Pager [phone]. If 7PM-7AM, please contact night-coverage at [email or web address], password [facility] 03/05/2016, 5:00 PM  LOS: 2 days

## 2016-03-06 DIAGNOSIS — Z950 Presence of cardiac pacemaker: Secondary | ICD-10-CM | POA: Diagnosis not present

## 2016-03-06 DIAGNOSIS — N183 Chronic kidney disease, stage 3 (moderate): Secondary | ICD-10-CM | POA: Diagnosis not present

## 2016-03-06 DIAGNOSIS — F0390 Unspecified dementia without behavioral disturbance: Secondary | ICD-10-CM | POA: Diagnosis not present

## 2016-03-06 DIAGNOSIS — Z853 Personal history of malignant neoplasm of breast: Secondary | ICD-10-CM | POA: Diagnosis not present

## 2016-03-06 DIAGNOSIS — J069 Acute upper respiratory infection, unspecified: Secondary | ICD-10-CM | POA: Diagnosis not present

## 2016-03-06 DIAGNOSIS — R0600 Dyspnea, unspecified: Secondary | ICD-10-CM | POA: Diagnosis not present

## 2016-03-06 DIAGNOSIS — I5042 Chronic combined systolic (congestive) and diastolic (congestive) heart failure: Secondary | ICD-10-CM | POA: Diagnosis not present

## 2016-03-06 DIAGNOSIS — N179 Acute kidney failure, unspecified: Secondary | ICD-10-CM | POA: Diagnosis not present

## 2016-03-06 DIAGNOSIS — R0689 Other abnormalities of breathing: Secondary | ICD-10-CM | POA: Diagnosis not present

## 2016-03-06 DIAGNOSIS — K219 Gastro-esophageal reflux disease without esophagitis: Secondary | ICD-10-CM | POA: Diagnosis not present

## 2016-03-06 DIAGNOSIS — N189 Chronic kidney disease, unspecified: Secondary | ICD-10-CM | POA: Diagnosis not present

## 2016-03-06 DIAGNOSIS — K257 Chronic gastric ulcer without hemorrhage or perforation: Secondary | ICD-10-CM | POA: Diagnosis not present

## 2016-03-06 DIAGNOSIS — I129 Hypertensive chronic kidney disease with stage 1 through stage 4 chronic kidney disease, or unspecified chronic kidney disease: Secondary | ICD-10-CM | POA: Diagnosis not present

## 2016-03-06 DIAGNOSIS — I252 Old myocardial infarction: Secondary | ICD-10-CM | POA: Diagnosis not present

## 2016-03-06 DIAGNOSIS — E039 Hypothyroidism, unspecified: Secondary | ICD-10-CM | POA: Diagnosis not present

## 2016-03-06 LAB — BASIC METABOLIC PANEL
ANION GAP: 9 (ref 5–15)
BUN: 28 mg/dL — AB (ref 6–20)
CALCIUM: 9.1 mg/dL (ref 8.9–10.3)
CO2: 26 mmol/L (ref 22–32)
CREATININE: 1.12 mg/dL — AB (ref 0.44–1.00)
Chloride: 110 mmol/L (ref 101–111)
GFR calc Af Amer: 51 mL/min — ABNORMAL LOW (ref >60)
GFR, EST NON AFRICAN AMERICAN: 44 mL/min — AB (ref >60)
GLUCOSE: 91 mg/dL (ref 65–99)
Potassium: 3.8 mmol/L (ref 3.5–5.1)
Sodium: 145 mmol/L (ref 135–145)

## 2016-03-06 MED ORDER — DOCUSATE SODIUM 100 MG PO CAPS: 100.0000 mg | ORAL_CAPSULE | Freq: Two times a day (BID) | ORAL | Status: AC

## 2016-03-06 NOTE — Care Management Important Message (Signed)
Important Message  Patient Details  Name: [name] MRN: [id number] Date of Birth: November 07, 1930   Medicare Important Message Given:  Yes    [name] 03/06/2016, 8:45 AM

## 2016-03-06 NOTE — Clinical Social Work Placement (Signed)
   CLINICAL SOCIAL WORK PLACEMENT  NOTE 03/06/16 - DISCHARGED TO [facility]  Date:  03/06/2016  Patient Details  Name: [name] MRN: [id number] Date of Birth: 01-11-1930  Clinical Social Work is seeking post-discharge placement for this patient at the [place] level of care (*CSW will initial, date and re-position this form in  chart as items are completed):  Yes   Patient/family provided with [place] Work Department's list of facilities offering this level of care within the geographic area requested by the patient (or if unable, by the patient's family).  Yes   Patient/family informed of their freedom to choose among providers that offer the needed level of care, that participate in Medicare, Medicaid or managed care program needed by the patient, have an available bed and are willing to accept the patient.  Yes   Patient/family informed of [place]'s ownership interest in [facility] and [facility], as well as of the fact that they are under no obligation to receive care at these facilities.  PASRR submitted to EDS on       PASRR number received on       Existing PASRR number confirmed on 03/05/16     FL2 transmitted to all facilities in geographic area requested by pt/family on 03/05/16     FL2 transmitted to all facilities within larger geographic area on       Patient informed that his/her managed care company has contracts with or will negotiate with certain facilities, including the following:        Yes   Patient/family informed of bed offers received.  Patient chooses bed at [facility]     Physician recommends and patient chooses bed at      Patient to be transferred to [facility] on  .  Patient to be transferred to facility by  [place]     Patient family notified on  03/06/16 of transfer.  Name of family member notified:   Daughter Phil Dopp [phone])      PHYSICIAN       Additional Comment:    _______________________________________________ Sable Feil, LCSW 03/06/2016, 3:13 PM

## 2016-03-06 NOTE — Progress Notes (Signed)
Report called to Era Nicola Girt at [facility].  All questions answered.

## 2016-03-06 NOTE — Care Management Note (Signed)
Case Management Note  Patient Details  Name: ANAELI KEMPEN MRN: [id number] Date of Birth: August 05, 1930  Subjective/Objective:          CM following for Progression and d/c planning.          Action/Plan: 03/06/2016 Noted consult for [facility] services, however, plan is for d/c to SNF. CSW aware and working with pt family. No HH or DME needs at this time.  Expected Discharge Date:     03/06/2016             Expected Discharge Plan:  [place]  In-House Referral:  Clinical Social Work  Discharge planning Services  NA  Post Acute Care Choice:  NA Choice offered to:  NA  DME Arranged:   NA DME Agency:   NA  HH Arranged:   NA HH Agency:   NA  Status of Service:  Completed, signed off  Medicare Important Message Given:  Yes Date Medicare IM Given:    Medicare IM give by:    Date Additional Medicare IM Given:    Additional Medicare Important Message give by:     If discussed at [place] of Stay Meetings, dates discussed:    Additional Comments:  Adron Bene, RN 03/06/2016, 11:13 AM

## 2016-03-06 NOTE — Clinical Social Work Note (Signed)
Clinical Social Work Assessment  Patient Details  Name: [name] MRN: [id number] Date of Birth: [birth year]  Date of referral:  03/03/16               Reason for consult:  Facility Placement                Permission sought to share information with:  Family Supports Permission granted to share information::  Yes, Verbal Permission Granted  Name::     Mikeal Hawthorne and Phil Dopp  Agency::     Relationship::  Daughters Phil Dopp is main contact)  Contact Information:  [name] [phone] and [name] - [phone].  Housing/Transportation Living arrangements for the past 2 months:  Single Family Home (Patient lives in a home on her daughter [place] property) Source of Information:  Adult Children (CSW talked with daughter [name] at the bedside and daughter [name] by phone. ) Patient Interpreter Needed:  None Criminal Activity/Legal Involvement Pertinent to Current Situation/Hospitalization:  No - Comment as needed Significant Relationships:  Other Family Members, Adult Children Lives with:  Self (Patient lives in a home on her daughter Donna's property) Do you feel safe going back to the place where you live?  No (Daughters feel that patient needs rehab before returning home) Need for family participation in patient care:  Yes (Comment)  Care giving concerns:  Daughters agree that patient needs rehab to be safer once at home. Patient lives alone in a home on her daughter's property and daughters want to assure that patient is stronger before being at home alone.    Social Worker assessment / plan:  CSW talked with patient and her daughter Kirkland Hun at the bedside regarding discharge planning. Patient was sitting in a chair and her daughter [name] was also in the room. Patient appears alert but allowed daughter to engage with CSW, however she did talk with CSW and daughter during the conversation. CSW also spoke with daughter Phil Dopp by phone during this  initial visit.  Daughters interested in a facility in [place] as [place] lives in [place] and [name] works in [place] - ([place] later informed that daughters interested in [place] as a family member is also there).  CSW explained the facility search process and provided family with skilled facility list for [facility].     Employment status:  Retired Forensic scientist:  [employer] PT Recommendations:  [place] / Referral to community resources:  [place] (Daughter provided with SNF list for [facility])  Patient/Family's Response to care:  Daughters expressed no concerns regarding patient's care during hospitalization.  Patient/Family's Understanding of and Emotional Response to Diagnosis, Current Treatment, and Prognosis:  Not discussed.  Emotional Assessment Appearance:  Appears stated age Attitude/Demeanor/Rapport:  Other (Appropriate) Affect (typically observed):  Appropriate, Pleasant Orientation:  Oriented to Self, Oriented to Place Alcohol / Substance use:  Never Used Psych involvement (Current and /or in the community):  No (Comment)  Discharge Needs  Concerns to be addressed:  Discharge Planning Concerns Readmission within the last 30 days:  No Current discharge risk:  None Barriers to Discharge:  No Barriers Identified   Sable Feil, LCSW 03/06/2016, 2:51 PM

## 2016-03-06 NOTE — Discharge Summary (Signed)
Physician Discharge Summary  [name] [id number] DOB: 1930-05-11 DOA: 03/02/2016  PCP: Fae Pippin  Admit date: 03/02/2016 Discharge date: 03/06/2016  Time spent: 35 minutes  Recommendations for Outpatient Follow-up:  Needs bmet to follow renal function.    Discharge Diagnoses:      Acute kidney injury superimposed on chronic kidney disease (HCC)   Dyspnea   GERD   Hypertension   Acute on chronic combined systolic and diastolic HF (heart failure) (HCC)   Diarrhea   Fatigue   Hypothyroidism   Discharge Condition: stable  Diet recommendation: heart healthy  Filed Weights   03/02/16 1839 03/02/16 2244 03/03/16 2021  Weight: 45.36 kg (100 lb) 44.2 kg (97 lb 7.1 oz) 45.2 kg (99 lb 10.4 oz)    History of present illness:  [name] is a 82 year old female with a past medical history significant for systolic and diastolic congestive heart failure, and NSTEMI in November 2016, dementia, hypothyroidism, anemia, hypertension; who presents with vague symptoms of fatigue and shortness of breath. Patient is somewhat of a poor historian, but appears at least able to give some. Also called and talked with daughter, [name] who provided similar history. She complained of some shortness of breath, diarrhea, and feelings of dizziness for which EMS was initially called.The patient had been having symptoms of diarrhea for the last 2 days and woke up this morning not feeling well. Patient reports having approximately 2 episodes of diarrhea. Daughter is unsure of any recent antibiotics being given in the last 30 days. Patient denies having any recent sick contacts Patient lives with a caregiver, but notes that they have to leave an order to care for things such as picking up their children and she feels alone. She asked if we are here to hurt her and that she feels nervous and anxious. Denies having any chest pain, nausea, vomiting, abdominal pain, or shortness of breath  currently. She reports appetite is unchanged and reports taking all of her medications as advised including Lasix. Patient was evaluated in the emergency department and seen have elevated creatinine function of 1.77 baseline was previously 1.2 at her last discharge. Patient's BNP and troponin levels were within normal limits. All other labs appeared to be similar to the previous.  Hospital Course:  Dyspnea: Acute. Patient with acute symptoms of shortness of breath now resolved. Question the etiology but given patient's history of Nstemi in 11/2015 waned to monitor patient overnight and ensure there was no cardiac cause to her symptoms. - troponins q3 hrs x 3, negative -resolved.  No evidence of HF exacerbation.  continue with lasix.   Acute kidney injury on chronic kidney disease stage III: Patient has a baseline creatinine of 1.2 per last discharge acutely elevated at 1.77 on admission.  -mild hypernatremia, cr still at 1.7. Change IV fluids to D 5.  - Held lisinopril for now -renal function stable on low dose lasix.   Diarrhea:  - suspect gastroenteritis. No further episode in the hospital  -resolved. Now with 2 days without BM. Start docusate.   Fatigue: Suspect secondary to dehydration, deconditioning,  - Continue to monitor. stable  Hypothyroidism - TSH at 1.1 - Continue levothyroxine for now and adjust if needed  Systolic and diastolic congestive heart failure:  Stable no signs of acute exacerbation on physical exam or lab work as BNP was within normal limits. Last echo noted EF to be 40-45% in 11/2015. - strict in and out. -continue with lasix. Monitor weight   Hypertension -  holding lisinopril due to AKI - Continue Coreg  Anemia: Hemoglobin appears stable at 10.9 similar to previous admissions in the last 2 months. - Continue to monitor  Thrombocytopenia:Acute on chronic. Patient seen have low platelet counts intermittently even in 10/2011 - Continue to  monitor  Dementia: - Continue [place] -sundown while in the hospital. She is calm, pleasantly confuse.   Jerrye Bushy - Continue Protonix  Procedures:  none  Consultations:  none  Discharge Exam: Filed Vitals:   03/05/16 1018 03/05/16 1736  BP: 145/85 100/75  Pulse: 61 76  Temp: 98 F (36.7 C) 97.5 F (36.4 C)  Resp: 18 18    General: Alert in no acute distress Cardiovascular: S 1, S 2 RRR Respiratory: CTA  Discharge Instructions   Discharge Instructions    Diet - low sodium heart healthy    Complete by:  As directed      Diet - low sodium heart healthy    Complete by:  As directed      Increase activity slowly    Complete by:  As directed      Increase activity slowly    Complete by:  As directed           Current Discharge Medication List    START taking these medications   Details  docusate sodium (COLACE) 100 MG capsule Take 1 capsule (100 mg total) by mouth 2 (two) times daily. Qty: 10 capsule, Refills: 0      CONTINUE these medications which have NOT CHANGED   Details  aspirin EC 81 MG tablet Take 81 mg by mouth daily.    carvedilol (COREG) 6.25 MG tablet TAKE 1 TABLET BY MOUTH TWICE A DAY WITH A MEAL Qty: 60 tablet, Refills: 5    cholecalciferol (VITAMIN D) 1000 UNITS tablet Take 1,000 Units by mouth 2 (two) times daily.     furosemide (LASIX) 20 MG tablet Take 1 tablet (20 mg total) by mouth daily. Qty: 30 tablet, Refills: 11    levothyroxine (SYNTHROID, LEVOTHROID) 125 MCG tablet Take 62.5 mcg by mouth daily before breakfast. Refills: 1    memantine (NAMENDA) 10 MG tablet Take 10 mg by mouth 2 (two) times daily.    pantoprazole (PROTONIX) 40 MG tablet Take 40 mg by mouth daily.    potassium chloride SA (K-DUR,KLOR-CON) 20 MEQ tablet Take 1 tablet (20 mEq total) by mouth daily. Qty: 30 tablet, Refills: 11    vitamin B-12 (CYANOCOBALAMIN) 1000 MCG tablet Take 500 mcg by mouth daily.       STOP taking these medications     lisinopril  (PRINIVIL,ZESTRIL) 10 MG tablet        Allergies  Allergen Reactions  . Pramipexole Dihydrochloride Other (See Comments)    Hallucinations from generic mirapex  . Sulfonamide Derivatives Rash   Follow-up Information    Follow up with [facility].   Contact information:   [address] [place] [address] [phone]       Follow up with [facility], PA-C.   Specialty:  Physician Assistant   Contact information:   [place] [address] [phone]        The results of significant diagnostics from this hospitalization (including imaging, microbiology, ancillary and laboratory) are listed below for reference.    Significant Diagnostic Studies: Dg Chest 2 View  03/02/2016  CLINICAL DATA:  Shortness of breath for 1 day. Cardiomyopathy. Complete heart block. Personal history of breast carcinoma. EXAM: CHEST  2 VIEW COMPARISON:  01/13/2016 FINDINGS: The heart size and mediastinal contours are within normal limits. Dual lead transvenous pacemaker remains in appropriate position. Both lungs are clear. No evidence of pneumothorax or pleural effusion. Surgical clips noted in right axilla. IMPRESSION: No active cardiopulmonary disease. Electronically Signed   By: Earle Gell M.D.   On: 03/02/2016 19:33    Microbiology: No results found for this or any previous visit (from the past 240 hour(s)).   Labs: Basic Metabolic Panel:  Recent Labs Lab 03/02/16 1857 03/03/16 0753 03/04/16 0557 03/06/16 0605  NA 143 147* 142 145  K 4.3 3.8 4.3 3.8  CL 105 109 109 110  CO2 24 23 24 26   GLUCOSE 131* 108* 114* 91  BUN 36* 36* 25* 28*  CREATININE 1.77* 1.76* 1.20* 1.12*  CALCIUM 9.3 9.1 8.8* 9.1   Liver Function Tests:  Recent Labs Lab 03/02/16 1857  AST 18  ALT 12*  ALKPHOS 63  BILITOT 1.4*  PROT 6.6  ALBUMIN 3.9   No results for input(s): LIPASE, AMYLASE in the last 168 hours. No results for input(s):  AMMONIA in the last 168 hours. CBC:  Recent Labs Lab 03/02/16 1857 03/04/16 0557  WBC 6.8 5.0  NEUTROABS 5.1  --   HGB 10.9* 10.2*  HCT 34.4* 31.9*  MCV 95.3 94.4  PLT 141* 126*   Cardiac Enzymes:  Recent Labs Lab 03/02/16 2245 03/03/16 0044 03/03/16 0434  TROPONINI 0.03 <0.03 0.03   BNP: BNP (last 3 results)  Recent Labs  11/13/15 1855 01/13/16 1441 03/02/16 1857  BNP 1217.1* 43.6 96.1    ProBNP (last 3 results) No results for input(s): PROBNP in the last 8760 hours.  CBG: No results for input(s): GLUCAP in the last 168 hours.     Signed:  Elmarie Shiley MD.  [facility] 03/06/2016, 8:22 AM

## 2016-03-14 DIAGNOSIS — I5042 Chronic combined systolic (congestive) and diastolic (congestive) heart failure: Secondary | ICD-10-CM | POA: Diagnosis not present

## 2016-03-14 DIAGNOSIS — J069 Acute upper respiratory infection, unspecified: Secondary | ICD-10-CM | POA: Diagnosis not present

## 2016-03-21 DIAGNOSIS — J069 Acute upper respiratory infection, unspecified: Secondary | ICD-10-CM | POA: Diagnosis not present

## 2016-03-21 DIAGNOSIS — I5042 Chronic combined systolic (congestive) and diastolic (congestive) heart failure: Secondary | ICD-10-CM | POA: Diagnosis not present

## 2016-03-27 DIAGNOSIS — J069 Acute upper respiratory infection, unspecified: Secondary | ICD-10-CM | POA: Diagnosis not present

## 2016-03-27 DIAGNOSIS — I5042 Chronic combined systolic (congestive) and diastolic (congestive) heart failure: Secondary | ICD-10-CM | POA: Diagnosis not present

## 2016-03-29 DIAGNOSIS — F39 Unspecified mood [affective] disorder: Secondary | ICD-10-CM | POA: Diagnosis not present

## 2016-03-29 DIAGNOSIS — F0390 Unspecified dementia without behavioral disturbance: Secondary | ICD-10-CM | POA: Diagnosis not present

## 2016-04-11 DIAGNOSIS — N390 Urinary tract infection, site not specified: Secondary | ICD-10-CM | POA: Diagnosis not present

## 2016-04-17 DIAGNOSIS — R05 Cough: Secondary | ICD-10-CM | POA: Diagnosis not present

## 2016-04-19 DIAGNOSIS — D649 Anemia, unspecified: Secondary | ICD-10-CM | POA: Diagnosis not present

## 2016-04-19 DIAGNOSIS — R946 Abnormal results of thyroid function studies: Secondary | ICD-10-CM | POA: Diagnosis not present

## 2016-05-07 ENCOUNTER — Ambulatory Visit: Payer: Medicare Other | Admitting: Neurology

## 2016-05-07 ENCOUNTER — Telehealth: Payer: Self-pay

## 2016-05-07 NOTE — Telephone Encounter (Signed)
Patient did not show to appt today  

## 2016-05-08 ENCOUNTER — Encounter: Payer: Self-pay | Admitting: Neurology

## 2016-05-10 DIAGNOSIS — F0390 Unspecified dementia without behavioral disturbance: Secondary | ICD-10-CM | POA: Diagnosis not present

## 2016-05-10 DIAGNOSIS — F39 Unspecified mood [affective] disorder: Secondary | ICD-10-CM | POA: Diagnosis not present

## 2016-05-17 DIAGNOSIS — M79642 Pain in left hand: Secondary | ICD-10-CM | POA: Diagnosis not present

## 2016-05-17 DIAGNOSIS — M25539 Pain in unspecified wrist: Secondary | ICD-10-CM | POA: Diagnosis not present

## 2016-07-05 DIAGNOSIS — F39 Unspecified mood [affective] disorder: Secondary | ICD-10-CM | POA: Diagnosis not present

## 2016-07-05 DIAGNOSIS — F0390 Unspecified dementia without behavioral disturbance: Secondary | ICD-10-CM | POA: Diagnosis not present

## 2016-08-28 DIAGNOSIS — K279 Peptic ulcer, site unspecified, unspecified as acute or chronic, without hemorrhage or perforation: Secondary | ICD-10-CM | POA: Diagnosis not present

## 2016-08-28 DIAGNOSIS — I5042 Chronic combined systolic (congestive) and diastolic (congestive) heart failure: Secondary | ICD-10-CM | POA: Diagnosis not present

## 2016-08-28 DIAGNOSIS — I10 Essential (primary) hypertension: Secondary | ICD-10-CM | POA: Diagnosis not present

## 2016-08-28 DIAGNOSIS — F0390 Unspecified dementia without behavioral disturbance: Secondary | ICD-10-CM | POA: Diagnosis not present

## 2016-08-28 DIAGNOSIS — E039 Hypothyroidism, unspecified: Secondary | ICD-10-CM | POA: Diagnosis not present

## 2016-08-28 DIAGNOSIS — Z9181 History of falling: Secondary | ICD-10-CM | POA: Diagnosis not present

## 2016-08-28 DIAGNOSIS — Z950 Presence of cardiac pacemaker: Secondary | ICD-10-CM | POA: Diagnosis not present

## 2016-08-28 DIAGNOSIS — R001 Bradycardia, unspecified: Secondary | ICD-10-CM | POA: Diagnosis not present

## 2016-09-06 DIAGNOSIS — E039 Hypothyroidism, unspecified: Secondary | ICD-10-CM | POA: Diagnosis not present

## 2016-09-07 DIAGNOSIS — K5900 Constipation, unspecified: Secondary | ICD-10-CM | POA: Diagnosis not present

## 2016-10-01 DIAGNOSIS — Z5189 Encounter for other specified aftercare: Secondary | ICD-10-CM | POA: Diagnosis not present

## 2016-10-01 DIAGNOSIS — D649 Anemia, unspecified: Secondary | ICD-10-CM | POA: Diagnosis not present

## 2016-10-01 DIAGNOSIS — E039 Hypothyroidism, unspecified: Secondary | ICD-10-CM | POA: Diagnosis not present

## 2016-10-01 DIAGNOSIS — R488 Other symbolic dysfunctions: Secondary | ICD-10-CM | POA: Diagnosis not present

## 2016-10-01 DIAGNOSIS — F0390 Unspecified dementia without behavioral disturbance: Secondary | ICD-10-CM | POA: Diagnosis not present

## 2016-10-01 DIAGNOSIS — I5042 Chronic combined systolic (congestive) and diastolic (congestive) heart failure: Secondary | ICD-10-CM | POA: Diagnosis not present

## 2016-10-01 DIAGNOSIS — R279 Unspecified lack of coordination: Secondary | ICD-10-CM | POA: Diagnosis not present

## 2016-10-01 DIAGNOSIS — Z950 Presence of cardiac pacemaker: Secondary | ICD-10-CM | POA: Diagnosis not present

## 2016-10-01 DIAGNOSIS — R001 Bradycardia, unspecified: Secondary | ICD-10-CM | POA: Diagnosis not present

## 2016-10-01 DIAGNOSIS — R2681 Unsteadiness on feet: Secondary | ICD-10-CM | POA: Diagnosis not present

## 2016-10-01 DIAGNOSIS — Z23 Encounter for immunization: Secondary | ICD-10-CM | POA: Diagnosis not present

## 2016-10-01 DIAGNOSIS — R41841 Cognitive communication deficit: Secondary | ICD-10-CM | POA: Diagnosis not present

## 2016-10-01 DIAGNOSIS — R159 Full incontinence of feces: Secondary | ICD-10-CM | POA: Diagnosis not present

## 2016-10-01 DIAGNOSIS — K219 Gastro-esophageal reflux disease without esophagitis: Secondary | ICD-10-CM | POA: Diagnosis not present

## 2016-10-02 DIAGNOSIS — R001 Bradycardia, unspecified: Secondary | ICD-10-CM | POA: Diagnosis not present

## 2016-10-02 DIAGNOSIS — R159 Full incontinence of feces: Secondary | ICD-10-CM | POA: Diagnosis not present

## 2016-10-02 DIAGNOSIS — F0390 Unspecified dementia without behavioral disturbance: Secondary | ICD-10-CM | POA: Diagnosis not present

## 2016-10-02 DIAGNOSIS — Z950 Presence of cardiac pacemaker: Secondary | ICD-10-CM | POA: Diagnosis not present

## 2016-10-02 DIAGNOSIS — R279 Unspecified lack of coordination: Secondary | ICD-10-CM | POA: Diagnosis not present

## 2016-10-02 DIAGNOSIS — Z5189 Encounter for other specified aftercare: Secondary | ICD-10-CM | POA: Diagnosis not present

## 2016-10-03 DIAGNOSIS — Z5189 Encounter for other specified aftercare: Secondary | ICD-10-CM | POA: Diagnosis not present

## 2016-10-03 DIAGNOSIS — F0390 Unspecified dementia without behavioral disturbance: Secondary | ICD-10-CM | POA: Diagnosis not present

## 2016-10-03 DIAGNOSIS — R159 Full incontinence of feces: Secondary | ICD-10-CM | POA: Diagnosis not present

## 2016-10-03 DIAGNOSIS — R001 Bradycardia, unspecified: Secondary | ICD-10-CM | POA: Diagnosis not present

## 2016-10-03 DIAGNOSIS — Z950 Presence of cardiac pacemaker: Secondary | ICD-10-CM | POA: Diagnosis not present

## 2016-10-03 DIAGNOSIS — R279 Unspecified lack of coordination: Secondary | ICD-10-CM | POA: Diagnosis not present

## 2016-10-04 DIAGNOSIS — R001 Bradycardia, unspecified: Secondary | ICD-10-CM | POA: Diagnosis not present

## 2016-10-04 DIAGNOSIS — Z950 Presence of cardiac pacemaker: Secondary | ICD-10-CM | POA: Diagnosis not present

## 2016-10-04 DIAGNOSIS — R159 Full incontinence of feces: Secondary | ICD-10-CM | POA: Diagnosis not present

## 2016-10-04 DIAGNOSIS — K279 Peptic ulcer, site unspecified, unspecified as acute or chronic, without hemorrhage or perforation: Secondary | ICD-10-CM | POA: Diagnosis not present

## 2016-10-04 DIAGNOSIS — E039 Hypothyroidism, unspecified: Secondary | ICD-10-CM | POA: Diagnosis not present

## 2016-10-04 DIAGNOSIS — Z5189 Encounter for other specified aftercare: Secondary | ICD-10-CM | POA: Diagnosis not present

## 2016-10-04 DIAGNOSIS — R279 Unspecified lack of coordination: Secondary | ICD-10-CM | POA: Diagnosis not present

## 2016-10-04 DIAGNOSIS — I5022 Chronic systolic (congestive) heart failure: Secondary | ICD-10-CM | POA: Diagnosis not present

## 2016-10-04 DIAGNOSIS — F0390 Unspecified dementia without behavioral disturbance: Secondary | ICD-10-CM | POA: Diagnosis not present

## 2016-10-05 DIAGNOSIS — R159 Full incontinence of feces: Secondary | ICD-10-CM | POA: Diagnosis not present

## 2016-10-05 DIAGNOSIS — R279 Unspecified lack of coordination: Secondary | ICD-10-CM | POA: Diagnosis not present

## 2016-10-05 DIAGNOSIS — Z950 Presence of cardiac pacemaker: Secondary | ICD-10-CM | POA: Diagnosis not present

## 2016-10-05 DIAGNOSIS — R001 Bradycardia, unspecified: Secondary | ICD-10-CM | POA: Diagnosis not present

## 2016-10-05 DIAGNOSIS — F0390 Unspecified dementia without behavioral disturbance: Secondary | ICD-10-CM | POA: Diagnosis not present

## 2016-10-05 DIAGNOSIS — Z5189 Encounter for other specified aftercare: Secondary | ICD-10-CM | POA: Diagnosis not present

## 2016-10-08 DIAGNOSIS — R001 Bradycardia, unspecified: Secondary | ICD-10-CM | POA: Diagnosis not present

## 2016-10-08 DIAGNOSIS — R159 Full incontinence of feces: Secondary | ICD-10-CM | POA: Diagnosis not present

## 2016-10-08 DIAGNOSIS — F0390 Unspecified dementia without behavioral disturbance: Secondary | ICD-10-CM | POA: Diagnosis not present

## 2016-10-08 DIAGNOSIS — Z950 Presence of cardiac pacemaker: Secondary | ICD-10-CM | POA: Diagnosis not present

## 2016-10-08 DIAGNOSIS — Z5189 Encounter for other specified aftercare: Secondary | ICD-10-CM | POA: Diagnosis not present

## 2016-10-08 DIAGNOSIS — R279 Unspecified lack of coordination: Secondary | ICD-10-CM | POA: Diagnosis not present

## 2016-10-09 DIAGNOSIS — R159 Full incontinence of feces: Secondary | ICD-10-CM | POA: Diagnosis not present

## 2016-10-09 DIAGNOSIS — R001 Bradycardia, unspecified: Secondary | ICD-10-CM | POA: Diagnosis not present

## 2016-10-09 DIAGNOSIS — Z5189 Encounter for other specified aftercare: Secondary | ICD-10-CM | POA: Diagnosis not present

## 2016-10-09 DIAGNOSIS — F0390 Unspecified dementia without behavioral disturbance: Secondary | ICD-10-CM | POA: Diagnosis not present

## 2016-10-09 DIAGNOSIS — R279 Unspecified lack of coordination: Secondary | ICD-10-CM | POA: Diagnosis not present

## 2016-10-09 DIAGNOSIS — Z950 Presence of cardiac pacemaker: Secondary | ICD-10-CM | POA: Diagnosis not present

## 2016-10-10 DIAGNOSIS — R279 Unspecified lack of coordination: Secondary | ICD-10-CM | POA: Diagnosis not present

## 2016-10-10 DIAGNOSIS — R159 Full incontinence of feces: Secondary | ICD-10-CM | POA: Diagnosis not present

## 2016-10-10 DIAGNOSIS — Z950 Presence of cardiac pacemaker: Secondary | ICD-10-CM | POA: Diagnosis not present

## 2016-10-10 DIAGNOSIS — R001 Bradycardia, unspecified: Secondary | ICD-10-CM | POA: Diagnosis not present

## 2016-10-10 DIAGNOSIS — Z5189 Encounter for other specified aftercare: Secondary | ICD-10-CM | POA: Diagnosis not present

## 2016-10-10 DIAGNOSIS — F0390 Unspecified dementia without behavioral disturbance: Secondary | ICD-10-CM | POA: Diagnosis not present

## 2016-10-11 DIAGNOSIS — R159 Full incontinence of feces: Secondary | ICD-10-CM | POA: Diagnosis not present

## 2016-10-11 DIAGNOSIS — F0390 Unspecified dementia without behavioral disturbance: Secondary | ICD-10-CM | POA: Diagnosis not present

## 2016-10-11 DIAGNOSIS — Z5189 Encounter for other specified aftercare: Secondary | ICD-10-CM | POA: Diagnosis not present

## 2016-10-11 DIAGNOSIS — R279 Unspecified lack of coordination: Secondary | ICD-10-CM | POA: Diagnosis not present

## 2016-10-11 DIAGNOSIS — R001 Bradycardia, unspecified: Secondary | ICD-10-CM | POA: Diagnosis not present

## 2016-10-11 DIAGNOSIS — Z950 Presence of cardiac pacemaker: Secondary | ICD-10-CM | POA: Diagnosis not present

## 2016-10-12 DIAGNOSIS — R001 Bradycardia, unspecified: Secondary | ICD-10-CM | POA: Diagnosis not present

## 2016-10-12 DIAGNOSIS — Z950 Presence of cardiac pacemaker: Secondary | ICD-10-CM | POA: Diagnosis not present

## 2016-10-12 DIAGNOSIS — R159 Full incontinence of feces: Secondary | ICD-10-CM | POA: Diagnosis not present

## 2016-10-12 DIAGNOSIS — F0390 Unspecified dementia without behavioral disturbance: Secondary | ICD-10-CM | POA: Diagnosis not present

## 2016-10-12 DIAGNOSIS — R279 Unspecified lack of coordination: Secondary | ICD-10-CM | POA: Diagnosis not present

## 2016-10-12 DIAGNOSIS — Z5189 Encounter for other specified aftercare: Secondary | ICD-10-CM | POA: Diagnosis not present

## 2016-10-13 DIAGNOSIS — Z5189 Encounter for other specified aftercare: Secondary | ICD-10-CM | POA: Diagnosis not present

## 2016-10-13 DIAGNOSIS — R279 Unspecified lack of coordination: Secondary | ICD-10-CM | POA: Diagnosis not present

## 2016-10-13 DIAGNOSIS — Z950 Presence of cardiac pacemaker: Secondary | ICD-10-CM | POA: Diagnosis not present

## 2016-10-13 DIAGNOSIS — F0390 Unspecified dementia without behavioral disturbance: Secondary | ICD-10-CM | POA: Diagnosis not present

## 2016-10-13 DIAGNOSIS — R001 Bradycardia, unspecified: Secondary | ICD-10-CM | POA: Diagnosis not present

## 2016-10-13 DIAGNOSIS — R159 Full incontinence of feces: Secondary | ICD-10-CM | POA: Diagnosis not present

## 2016-10-15 DIAGNOSIS — Z5189 Encounter for other specified aftercare: Secondary | ICD-10-CM | POA: Diagnosis not present

## 2016-10-15 DIAGNOSIS — F0390 Unspecified dementia without behavioral disturbance: Secondary | ICD-10-CM | POA: Diagnosis not present

## 2016-10-15 DIAGNOSIS — R279 Unspecified lack of coordination: Secondary | ICD-10-CM | POA: Diagnosis not present

## 2016-10-15 DIAGNOSIS — R001 Bradycardia, unspecified: Secondary | ICD-10-CM | POA: Diagnosis not present

## 2016-10-15 DIAGNOSIS — R159 Full incontinence of feces: Secondary | ICD-10-CM | POA: Diagnosis not present

## 2016-10-15 DIAGNOSIS — M545 Low back pain: Secondary | ICD-10-CM | POA: Diagnosis not present

## 2016-10-15 DIAGNOSIS — R300 Dysuria: Secondary | ICD-10-CM | POA: Diagnosis not present

## 2016-10-15 DIAGNOSIS — Z950 Presence of cardiac pacemaker: Secondary | ICD-10-CM | POA: Diagnosis not present

## 2016-10-16 DIAGNOSIS — R159 Full incontinence of feces: Secondary | ICD-10-CM | POA: Diagnosis not present

## 2016-10-16 DIAGNOSIS — R001 Bradycardia, unspecified: Secondary | ICD-10-CM | POA: Diagnosis not present

## 2016-10-16 DIAGNOSIS — Z5189 Encounter for other specified aftercare: Secondary | ICD-10-CM | POA: Diagnosis not present

## 2016-10-16 DIAGNOSIS — R279 Unspecified lack of coordination: Secondary | ICD-10-CM | POA: Diagnosis not present

## 2016-10-16 DIAGNOSIS — F0390 Unspecified dementia without behavioral disturbance: Secondary | ICD-10-CM | POA: Diagnosis not present

## 2016-10-16 DIAGNOSIS — Z950 Presence of cardiac pacemaker: Secondary | ICD-10-CM | POA: Diagnosis not present

## 2016-10-17 DIAGNOSIS — R279 Unspecified lack of coordination: Secondary | ICD-10-CM | POA: Diagnosis not present

## 2016-10-17 DIAGNOSIS — R159 Full incontinence of feces: Secondary | ICD-10-CM | POA: Diagnosis not present

## 2016-10-17 DIAGNOSIS — Z5189 Encounter for other specified aftercare: Secondary | ICD-10-CM | POA: Diagnosis not present

## 2016-10-17 DIAGNOSIS — R001 Bradycardia, unspecified: Secondary | ICD-10-CM | POA: Diagnosis not present

## 2016-10-17 DIAGNOSIS — F0390 Unspecified dementia without behavioral disturbance: Secondary | ICD-10-CM | POA: Diagnosis not present

## 2016-10-17 DIAGNOSIS — Z950 Presence of cardiac pacemaker: Secondary | ICD-10-CM | POA: Diagnosis not present

## 2016-10-18 DIAGNOSIS — Z5189 Encounter for other specified aftercare: Secondary | ICD-10-CM | POA: Diagnosis not present

## 2016-10-18 DIAGNOSIS — R159 Full incontinence of feces: Secondary | ICD-10-CM | POA: Diagnosis not present

## 2016-10-18 DIAGNOSIS — Z950 Presence of cardiac pacemaker: Secondary | ICD-10-CM | POA: Diagnosis not present

## 2016-10-18 DIAGNOSIS — R279 Unspecified lack of coordination: Secondary | ICD-10-CM | POA: Diagnosis not present

## 2016-10-18 DIAGNOSIS — F0390 Unspecified dementia without behavioral disturbance: Secondary | ICD-10-CM | POA: Diagnosis not present

## 2016-10-18 DIAGNOSIS — R001 Bradycardia, unspecified: Secondary | ICD-10-CM | POA: Diagnosis not present

## 2016-10-19 DIAGNOSIS — R279 Unspecified lack of coordination: Secondary | ICD-10-CM | POA: Diagnosis not present

## 2016-10-19 DIAGNOSIS — R159 Full incontinence of feces: Secondary | ICD-10-CM | POA: Diagnosis not present

## 2016-10-19 DIAGNOSIS — R001 Bradycardia, unspecified: Secondary | ICD-10-CM | POA: Diagnosis not present

## 2016-10-19 DIAGNOSIS — Z950 Presence of cardiac pacemaker: Secondary | ICD-10-CM | POA: Diagnosis not present

## 2016-10-19 DIAGNOSIS — Z5189 Encounter for other specified aftercare: Secondary | ICD-10-CM | POA: Diagnosis not present

## 2016-10-19 DIAGNOSIS — F0390 Unspecified dementia without behavioral disturbance: Secondary | ICD-10-CM | POA: Diagnosis not present

## 2016-10-21 DIAGNOSIS — R001 Bradycardia, unspecified: Secondary | ICD-10-CM | POA: Diagnosis not present

## 2016-10-21 DIAGNOSIS — R279 Unspecified lack of coordination: Secondary | ICD-10-CM | POA: Diagnosis not present

## 2016-10-21 DIAGNOSIS — Z950 Presence of cardiac pacemaker: Secondary | ICD-10-CM | POA: Diagnosis not present

## 2016-10-21 DIAGNOSIS — R159 Full incontinence of feces: Secondary | ICD-10-CM | POA: Diagnosis not present

## 2016-10-21 DIAGNOSIS — F0390 Unspecified dementia without behavioral disturbance: Secondary | ICD-10-CM | POA: Diagnosis not present

## 2016-10-21 DIAGNOSIS — Z5189 Encounter for other specified aftercare: Secondary | ICD-10-CM | POA: Diagnosis not present

## 2016-10-22 DIAGNOSIS — R001 Bradycardia, unspecified: Secondary | ICD-10-CM | POA: Diagnosis not present

## 2016-10-22 DIAGNOSIS — F0390 Unspecified dementia without behavioral disturbance: Secondary | ICD-10-CM | POA: Diagnosis not present

## 2016-10-22 DIAGNOSIS — Z5189 Encounter for other specified aftercare: Secondary | ICD-10-CM | POA: Diagnosis not present

## 2016-10-22 DIAGNOSIS — Z950 Presence of cardiac pacemaker: Secondary | ICD-10-CM | POA: Diagnosis not present

## 2016-10-22 DIAGNOSIS — R279 Unspecified lack of coordination: Secondary | ICD-10-CM | POA: Diagnosis not present

## 2016-10-22 DIAGNOSIS — R159 Full incontinence of feces: Secondary | ICD-10-CM | POA: Diagnosis not present

## 2016-10-23 DIAGNOSIS — R279 Unspecified lack of coordination: Secondary | ICD-10-CM | POA: Diagnosis not present

## 2016-10-23 DIAGNOSIS — R001 Bradycardia, unspecified: Secondary | ICD-10-CM | POA: Diagnosis not present

## 2016-10-23 DIAGNOSIS — R159 Full incontinence of feces: Secondary | ICD-10-CM | POA: Diagnosis not present

## 2016-10-23 DIAGNOSIS — F0390 Unspecified dementia without behavioral disturbance: Secondary | ICD-10-CM | POA: Diagnosis not present

## 2016-10-23 DIAGNOSIS — Z5189 Encounter for other specified aftercare: Secondary | ICD-10-CM | POA: Diagnosis not present

## 2016-10-23 DIAGNOSIS — Z950 Presence of cardiac pacemaker: Secondary | ICD-10-CM | POA: Diagnosis not present

## 2016-10-24 DIAGNOSIS — F0390 Unspecified dementia without behavioral disturbance: Secondary | ICD-10-CM | POA: Diagnosis not present

## 2016-10-24 DIAGNOSIS — R279 Unspecified lack of coordination: Secondary | ICD-10-CM | POA: Diagnosis not present

## 2016-10-24 DIAGNOSIS — Z950 Presence of cardiac pacemaker: Secondary | ICD-10-CM | POA: Diagnosis not present

## 2016-10-24 DIAGNOSIS — R001 Bradycardia, unspecified: Secondary | ICD-10-CM | POA: Diagnosis not present

## 2016-10-24 DIAGNOSIS — R159 Full incontinence of feces: Secondary | ICD-10-CM | POA: Diagnosis not present

## 2016-10-24 DIAGNOSIS — Z5189 Encounter for other specified aftercare: Secondary | ICD-10-CM | POA: Diagnosis not present

## 2016-10-25 DIAGNOSIS — R001 Bradycardia, unspecified: Secondary | ICD-10-CM | POA: Diagnosis not present

## 2016-10-25 DIAGNOSIS — R279 Unspecified lack of coordination: Secondary | ICD-10-CM | POA: Diagnosis not present

## 2016-10-25 DIAGNOSIS — F0390 Unspecified dementia without behavioral disturbance: Secondary | ICD-10-CM | POA: Diagnosis not present

## 2016-10-25 DIAGNOSIS — Z950 Presence of cardiac pacemaker: Secondary | ICD-10-CM | POA: Diagnosis not present

## 2016-10-25 DIAGNOSIS — R159 Full incontinence of feces: Secondary | ICD-10-CM | POA: Diagnosis not present

## 2016-10-25 DIAGNOSIS — Z5189 Encounter for other specified aftercare: Secondary | ICD-10-CM | POA: Diagnosis not present

## 2016-10-26 DIAGNOSIS — Z5189 Encounter for other specified aftercare: Secondary | ICD-10-CM | POA: Diagnosis not present

## 2016-10-26 DIAGNOSIS — R279 Unspecified lack of coordination: Secondary | ICD-10-CM | POA: Diagnosis not present

## 2016-10-26 DIAGNOSIS — R001 Bradycardia, unspecified: Secondary | ICD-10-CM | POA: Diagnosis not present

## 2016-10-26 DIAGNOSIS — F0390 Unspecified dementia without behavioral disturbance: Secondary | ICD-10-CM | POA: Diagnosis not present

## 2016-10-26 DIAGNOSIS — Z950 Presence of cardiac pacemaker: Secondary | ICD-10-CM | POA: Diagnosis not present

## 2016-10-26 DIAGNOSIS — R159 Full incontinence of feces: Secondary | ICD-10-CM | POA: Diagnosis not present

## 2016-10-29 DIAGNOSIS — R279 Unspecified lack of coordination: Secondary | ICD-10-CM | POA: Diagnosis not present

## 2016-10-29 DIAGNOSIS — R159 Full incontinence of feces: Secondary | ICD-10-CM | POA: Diagnosis not present

## 2016-10-29 DIAGNOSIS — Z950 Presence of cardiac pacemaker: Secondary | ICD-10-CM | POA: Diagnosis not present

## 2016-10-29 DIAGNOSIS — R001 Bradycardia, unspecified: Secondary | ICD-10-CM | POA: Diagnosis not present

## 2016-10-29 DIAGNOSIS — Z5189 Encounter for other specified aftercare: Secondary | ICD-10-CM | POA: Diagnosis not present

## 2016-10-29 DIAGNOSIS — F0390 Unspecified dementia without behavioral disturbance: Secondary | ICD-10-CM | POA: Diagnosis not present

## 2016-10-30 DIAGNOSIS — F39 Unspecified mood [affective] disorder: Secondary | ICD-10-CM | POA: Diagnosis not present

## 2016-10-30 DIAGNOSIS — Z950 Presence of cardiac pacemaker: Secondary | ICD-10-CM | POA: Diagnosis not present

## 2016-10-30 DIAGNOSIS — R001 Bradycardia, unspecified: Secondary | ICD-10-CM | POA: Diagnosis not present

## 2016-10-30 DIAGNOSIS — G4700 Insomnia, unspecified: Secondary | ICD-10-CM | POA: Diagnosis not present

## 2016-10-30 DIAGNOSIS — F0390 Unspecified dementia without behavioral disturbance: Secondary | ICD-10-CM | POA: Diagnosis not present

## 2016-10-30 DIAGNOSIS — R279 Unspecified lack of coordination: Secondary | ICD-10-CM | POA: Diagnosis not present

## 2016-10-30 DIAGNOSIS — Z5189 Encounter for other specified aftercare: Secondary | ICD-10-CM | POA: Diagnosis not present

## 2016-10-30 DIAGNOSIS — R159 Full incontinence of feces: Secondary | ICD-10-CM | POA: Diagnosis not present

## 2016-10-31 DIAGNOSIS — R159 Full incontinence of feces: Secondary | ICD-10-CM | POA: Diagnosis not present

## 2016-10-31 DIAGNOSIS — Z5189 Encounter for other specified aftercare: Secondary | ICD-10-CM | POA: Diagnosis not present

## 2016-10-31 DIAGNOSIS — F0390 Unspecified dementia without behavioral disturbance: Secondary | ICD-10-CM | POA: Diagnosis not present

## 2016-10-31 DIAGNOSIS — R279 Unspecified lack of coordination: Secondary | ICD-10-CM | POA: Diagnosis not present

## 2016-10-31 DIAGNOSIS — R001 Bradycardia, unspecified: Secondary | ICD-10-CM | POA: Diagnosis not present

## 2016-10-31 DIAGNOSIS — Z950 Presence of cardiac pacemaker: Secondary | ICD-10-CM | POA: Diagnosis not present

## 2016-10-31 DIAGNOSIS — R488 Other symbolic dysfunctions: Secondary | ICD-10-CM | POA: Diagnosis not present

## 2016-11-01 DIAGNOSIS — R001 Bradycardia, unspecified: Secondary | ICD-10-CM | POA: Diagnosis not present

## 2016-11-01 DIAGNOSIS — R279 Unspecified lack of coordination: Secondary | ICD-10-CM | POA: Diagnosis not present

## 2016-11-01 DIAGNOSIS — I5042 Chronic combined systolic (congestive) and diastolic (congestive) heart failure: Secondary | ICD-10-CM | POA: Diagnosis not present

## 2016-11-01 DIAGNOSIS — Z950 Presence of cardiac pacemaker: Secondary | ICD-10-CM | POA: Diagnosis not present

## 2016-11-01 DIAGNOSIS — R159 Full incontinence of feces: Secondary | ICD-10-CM | POA: Diagnosis not present

## 2016-11-01 DIAGNOSIS — F0390 Unspecified dementia without behavioral disturbance: Secondary | ICD-10-CM | POA: Diagnosis not present

## 2016-11-01 DIAGNOSIS — Z5189 Encounter for other specified aftercare: Secondary | ICD-10-CM | POA: Diagnosis not present

## 2016-11-01 DIAGNOSIS — K279 Peptic ulcer, site unspecified, unspecified as acute or chronic, without hemorrhage or perforation: Secondary | ICD-10-CM | POA: Diagnosis not present

## 2016-11-02 DIAGNOSIS — R159 Full incontinence of feces: Secondary | ICD-10-CM | POA: Diagnosis not present

## 2016-11-02 DIAGNOSIS — Z5189 Encounter for other specified aftercare: Secondary | ICD-10-CM | POA: Diagnosis not present

## 2016-11-02 DIAGNOSIS — Z950 Presence of cardiac pacemaker: Secondary | ICD-10-CM | POA: Diagnosis not present

## 2016-11-02 DIAGNOSIS — R279 Unspecified lack of coordination: Secondary | ICD-10-CM | POA: Diagnosis not present

## 2016-11-02 DIAGNOSIS — R001 Bradycardia, unspecified: Secondary | ICD-10-CM | POA: Diagnosis not present

## 2016-11-02 DIAGNOSIS — F0390 Unspecified dementia without behavioral disturbance: Secondary | ICD-10-CM | POA: Diagnosis not present

## 2016-11-08 DIAGNOSIS — H353132 Nonexudative age-related macular degeneration, bilateral, intermediate dry stage: Secondary | ICD-10-CM | POA: Diagnosis not present

## 2016-11-08 DIAGNOSIS — H26493 Other secondary cataract, bilateral: Secondary | ICD-10-CM | POA: Diagnosis not present

## 2016-11-16 ENCOUNTER — Encounter: Payer: Medicare Other | Admitting: Internal Medicine

## 2016-11-26 DIAGNOSIS — K219 Gastro-esophageal reflux disease without esophagitis: Secondary | ICD-10-CM | POA: Diagnosis not present

## 2016-11-26 DIAGNOSIS — Z5189 Encounter for other specified aftercare: Secondary | ICD-10-CM | POA: Diagnosis not present

## 2016-11-26 DIAGNOSIS — R1310 Dysphagia, unspecified: Secondary | ICD-10-CM | POA: Diagnosis not present

## 2016-11-26 DIAGNOSIS — F0390 Unspecified dementia without behavioral disturbance: Secondary | ICD-10-CM | POA: Diagnosis not present

## 2016-11-26 DIAGNOSIS — I5042 Chronic combined systolic (congestive) and diastolic (congestive) heart failure: Secondary | ICD-10-CM | POA: Diagnosis not present

## 2016-11-30 DIAGNOSIS — F0390 Unspecified dementia without behavioral disturbance: Secondary | ICD-10-CM | POA: Diagnosis not present

## 2016-11-30 DIAGNOSIS — I5042 Chronic combined systolic (congestive) and diastolic (congestive) heart failure: Secondary | ICD-10-CM | POA: Diagnosis not present

## 2016-12-06 ENCOUNTER — Encounter: Payer: Self-pay | Admitting: *Deleted

## 2016-12-11 ENCOUNTER — Encounter (INDEPENDENT_AMBULATORY_CARE_PROVIDER_SITE_OTHER): Payer: Self-pay

## 2016-12-11 ENCOUNTER — Ambulatory Visit (INDEPENDENT_AMBULATORY_CARE_PROVIDER_SITE_OTHER): Payer: Medicare Other | Admitting: Internal Medicine

## 2016-12-11 ENCOUNTER — Encounter: Payer: Self-pay | Admitting: Internal Medicine

## 2016-12-11 VITALS — BP 130/80 | HR 60 | Ht 60.0 in | Wt 121.4 lb

## 2016-12-11 DIAGNOSIS — Z950 Presence of cardiac pacemaker: Secondary | ICD-10-CM

## 2016-12-11 DIAGNOSIS — I10 Essential (primary) hypertension: Secondary | ICD-10-CM

## 2016-12-11 LAB — CUP PACEART INCLINIC DEVICE CHECK
Implantable Lead Location: 753860
Implantable Pulse Generator Implant Date: 20121001
Lead Channel Pacing Threshold Amplitude: 0.625 V
Lead Channel Pacing Threshold Amplitude: 0.875 V
Lead Channel Pacing Threshold Pulse Width: 0.4 ms
Lead Channel Pacing Threshold Pulse Width: 0.4 ms
Lead Channel Sensing Intrinsic Amplitude: 4.2 mV
Lead Channel Setting Pacing Amplitude: 1.875
Lead Channel Setting Pacing Pulse Width: 0.4 ms
Lead Channel Setting Sensing Sensitivity: 1 mV
MDC IDC LEAD IMPLANT DT: 20121001
MDC IDC LEAD IMPLANT DT: 20121001
MDC IDC LEAD LOCATION: 753859
MDC IDC MSMT BATTERY VOLTAGE: 2.93 V
MDC IDC MSMT LEADCHNL RA IMPEDANCE VALUE: 340 Ohm
MDC IDC MSMT LEADCHNL RA SENSING INTR AMPL: 2 mV
MDC IDC MSMT LEADCHNL RV IMPEDANCE VALUE: 630 Ohm
MDC IDC SESS DTM: 20171212120409
MDC IDC SET LEADCHNL RV PACING AMPLITUDE: 0.875
MDC IDC STAT BRADY RA PERCENT PACED: 42 %
MDC IDC STAT BRADY RV PERCENT PACED: 14 %
Pulse Gen Serial Number: 7271891

## 2016-12-11 NOTE — Patient Instructions (Addendum)
Medication Instructions:  Your physician recommends that you continue on your current medications as directed. Please refer to the Current Medication list given to you today.   Labwork: None Ordered   Testing/Procedures: None Ordered   Follow-Up: Your physician wants you to follow-up in: 6 months in [facility] and 1 year with Dr. Lovena Le. You will receive a reminder letter in the mail two months in advance. If you don't receive a letter, please call our office to schedule the follow-up appointment.     Any Other Special Instructions Will Be Listed Below (If Applicable).     If you need a refill on your cardiac medications before your next appointment, please call your pharmacy.

## 2016-12-11 NOTE — Progress Notes (Signed)
HPI [name] returns today for followup. She is a very pleasant 82 year old woman with symptomatic bradycardia, status post permanent pacemaker insertion, hypertension, and a sedentary lifestyle. She has had a marked decline since I last saw her with dementia. She is in a SNF. Her memory if very poor and she is confused. She does not appear to have fallen. Allergies  Allergen Reactions  . Pramipexole Dihydrochloride Other (See Comments)    Hallucinations from generic mirapex  . Sulfonamide Derivatives Rash     Current Outpatient Prescriptions  Medication Sig Dispense Refill  . acetaminophen (TYLENOL) 650 MG CR tablet Take 650 mg by mouth every 4 (four) hours as needed for pain.    Marland Kitchen aspirin EC 81 MG tablet Take 81 mg by mouth daily.    . carvedilol (COREG) 6.25 MG tablet TAKE 1 TABLET BY MOUTH TWICE A DAY WITH A MEAL 60 tablet 5  . cholecalciferol (VITAMIN D) 1000 UNITS tablet Take 1,000 Units by mouth 2 (two) times daily.     Marland Kitchen docusate sodium (COLACE) 100 MG capsule Take 1 capsule (100 mg total) by mouth 2 (two) times daily. 10 capsule 0  . furosemide (LASIX) 20 MG tablet Take 1 tablet (20 mg total) by mouth daily. 30 tablet 11  . levothyroxine (SYNTHROID, LEVOTHROID) 125 MCG tablet Take 62.5 mcg by mouth daily before breakfast.  1  . memantine (NAMENDA) 10 MG tablet Take 10 mg by mouth 2 (two) times daily.    . Multiple Vitamins-Minerals (EYE-VITES PO) Take 2 tablets by mouth daily.    Marland Kitchen nystatin cream (MYCOSTATIN) Apply 1 application topically 2 (two) times daily.    . pantoprazole (PROTONIX) 40 MG tablet Take 40 mg by mouth daily.    . polyethylene glycol (MIRALAX / GLYCOLAX) packet Take 17 g by mouth every 12 (twelve) hours.    . potassium chloride SA (K-DUR,KLOR-CON) 20 MEQ tablet Take 1 tablet (20 mEq total) by mouth daily. 30 tablet 11  . QUEtiapine (SEROQUEL) 25 MG tablet Take 25 mg by mouth 2 (two) times daily.    . traZODone (DESYREL) 25 mg TABS tablet Take 25 mg by mouth  at bedtime.    . vitamin B-12 (CYANOCOBALAMIN) 1000 MCG tablet Take 500 mcg by mouth daily.      No current facility-administered medications for this visit.      Past Medical History:  Diagnosis Date  . Arthritis   . Breast cancer ([place])   . Cardiomyopathy   . Complete heart block (HCC)    S/P pacemaker  . CONSTIPATION   . GASTRIC ULCER   . GERD   . HTN (hypertension)   . Hypothyroidism   . Osteoporosis   . Vitamin D deficiency     ROS:   All systems reviewed and negative except as noted in the HPI.   Past Surgical History:  Procedure Laterality Date  . CHOLECYSTECTOMY    . DOPPLER ECHOCARDIOGRAPHY  2007     Family History  Problem Relation Age of Onset  . Breast cancer Mother      Social History   Social History  . Marital status: Widowed    Spouse name: N/A  . Number of children: 3  . Years of education: 12   Occupational History  .      retired   Social History Main Topics  . Smoking status: Never Smoker  . Smokeless tobacco: Never Used  . Alcohol use No  . Drug use: No  . Sexual activity: Not  on file   Other Topics Concern  . Not on file   Social History Narrative   Patient consumes  Caffeine daily     Wt 121 lb 6 oz (55.1 kg)   BMI 20.83 kg/m   Physical Exam:  Elderly and confused appearing woman, NAD HEENT: Unremarkable Neck:  6 cm JVD, no thyromegally Lungs:  Clear with no wheezes, rales, or rhonchi. HEART:  Regular rate rhythm, no murmurs, no rubs, no clicks Abd:  soft, positive bowel sounds, no organomegally, no rebound, no guarding Ext:  2 plus pulses, no edema, no cyanosis, no clubbing Skin:  No rashes no nodules Neuro:  CN II through XII intact, motor grossly intact  DEVICE  Normal device function.  See PaceArt for details.   Assess/Plan:  1. Sinus node dysfunction - she is asymptomatic, s/p PPM insertion 2. PPM - her [employer] DDD PM is working normally.  3. Chronic systolic/diastolic heart failure - her symptoms  are well controlled. No change in meds.  Mikle Bosworth.D.

## 2016-12-22 DIAGNOSIS — R4182 Altered mental status, unspecified: Secondary | ICD-10-CM | POA: Diagnosis not present

## 2017-03-31 DEATH — deceased

## 2017-05-09 IMAGING — CR DG ABDOMEN ACUTE W/ 1V CHEST
3 series · 3 of 3 positions shown · non-contrast
Comparison: Chest x-ray 11/13/2015, 10/02/2011 and pelvis/ lumbar
spine 01/19/2015.

CLINICAL DATA: Lower abdominal pain and diarrhea today.

EXAM:
DG ABDOMEN ACUTE W/ 1V CHEST

[abdomen supine]
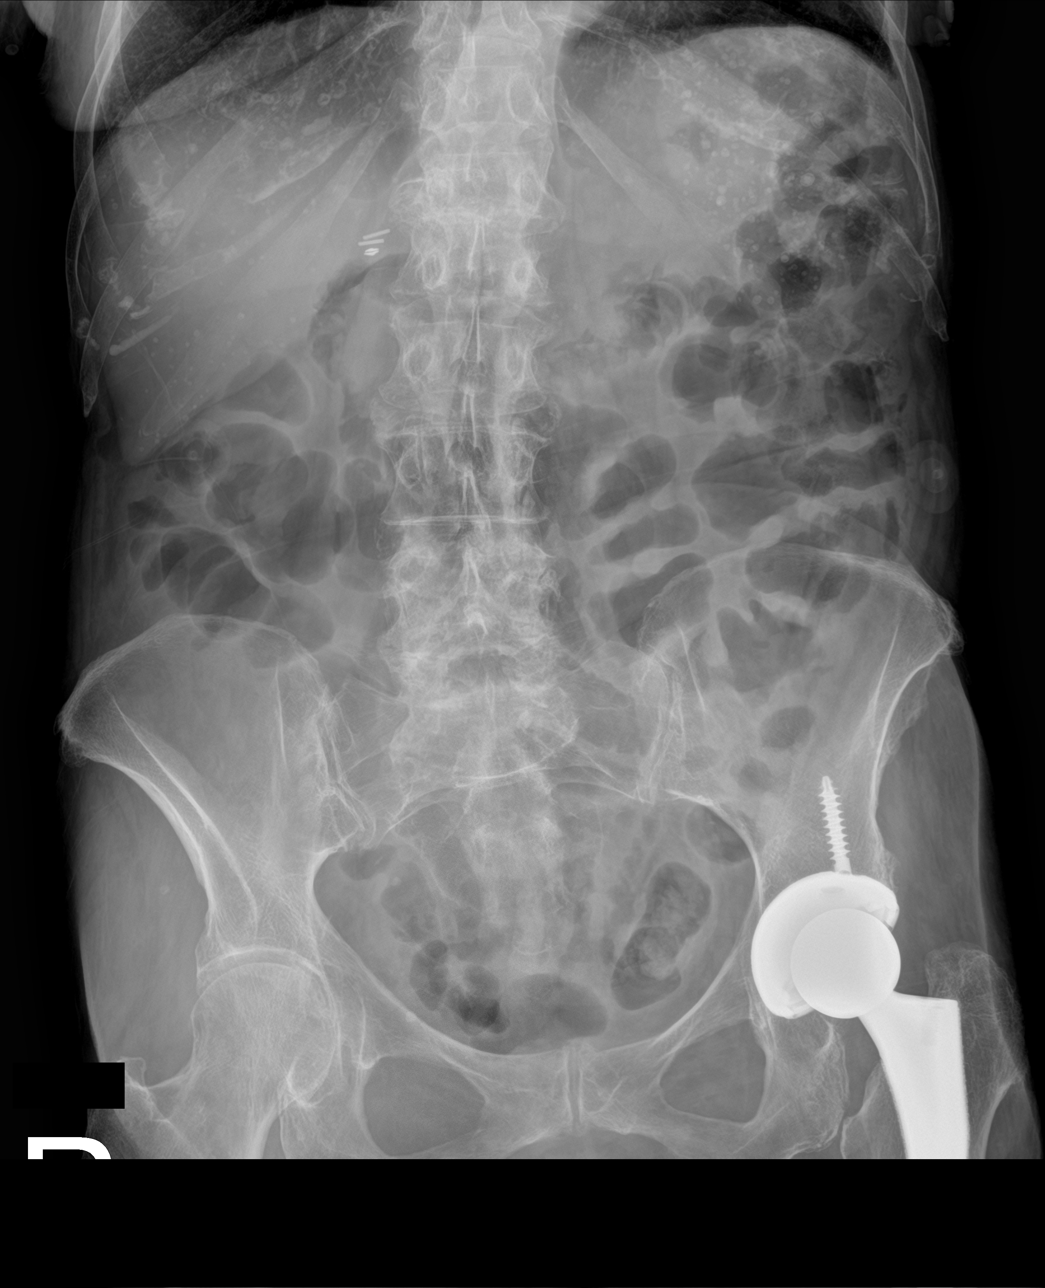

[abdomen decu]
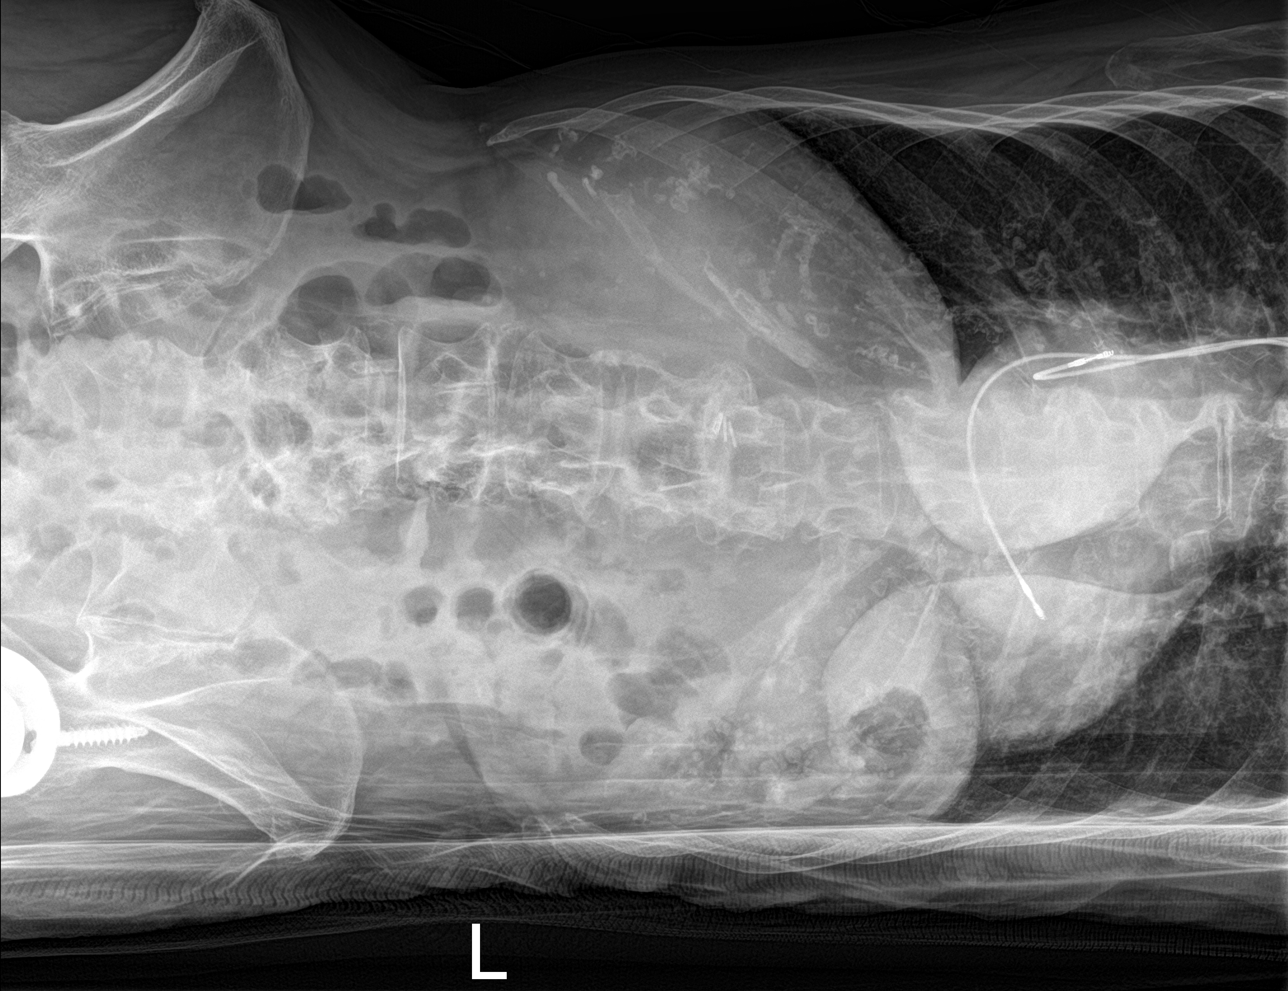

[chest ap]
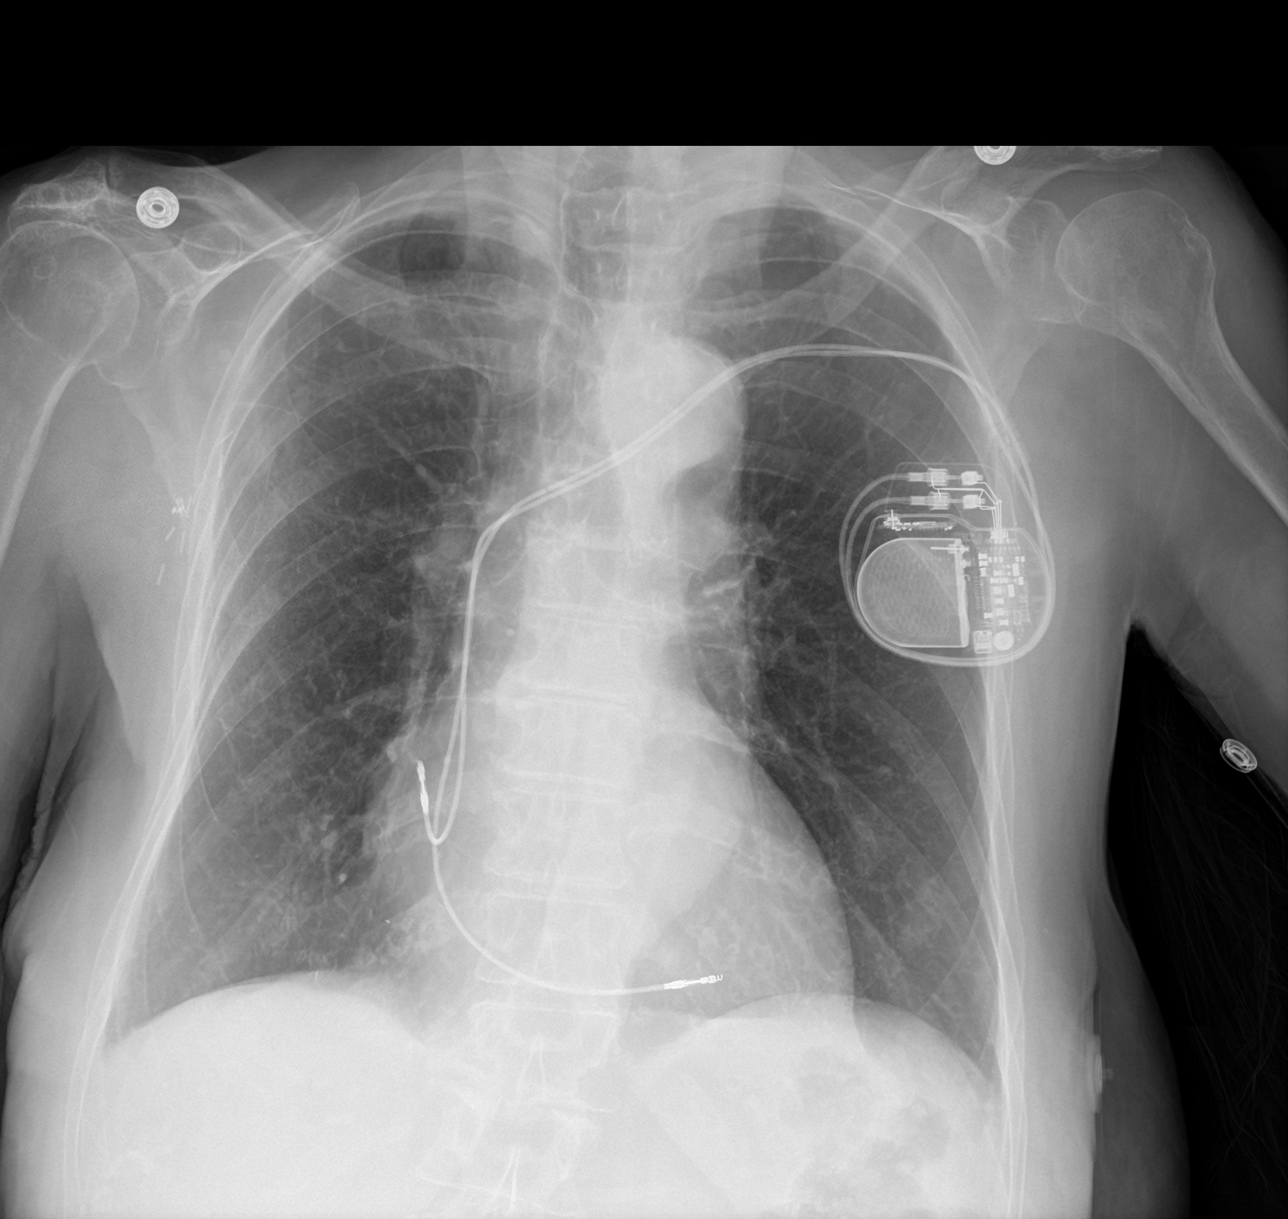

[3 of 3 positions shown; findings below may reference images not displayed]

FINDINGS: Left-sided pacemaker unchanged. Lungs are adequately inflated
without consolidation or effusion. Cardiomediastinal silhouette and
remainder of the chest is unchanged.

Abdominal images demonstrate a nonobstructive bowel gas pattern. No
free peritoneal air. A few air-fluid levels noted on the decubitus
film. Surgical clips over the right upper quadrant. Numerous small
calcified granulomas over the liver and spleen. Left hip
arthroplasty unchanged. Stable degenerative changes of the spine and
right hip.
IMPRESSION: Nonobstructive bowel gas pattern.  No acute cardiopulmonary disease.

## 2019-05-07 ENCOUNTER — Telehealth: Payer: Self-pay | Admitting: Cardiology

## 2019-05-07 NOTE — Telephone Encounter (Signed)
Call pt due to no follow up since 2018. Pt daughter stated that pt died 2 years ago.
